# Patient Record
Sex: Male | Born: 1960 | Race: White | Hispanic: No | Marital: Single | Smoking: Never smoker
Health system: Southern US, Community
[De-identification: ages and names within clinical notes are randomized; demographics above are authoritative.]

## PROBLEM LIST (undated history)

## (undated) DIAGNOSIS — I639 Cerebral infarction, unspecified: Secondary | ICD-10-CM

---

## 2020-06-30 ENCOUNTER — Emergency Department (HOSPITAL_COMMUNITY)
Admission: EM | Admit: 2020-06-30 | Discharge: 2020-06-30 | Disposition: A | Discharge status: Home / Self Care | Payer: Medicaid Other | Attending: Emergency Medicine | Admitting: Emergency Medicine

## 2020-06-30 ENCOUNTER — Encounter (HOSPITAL_COMMUNITY): Payer: Self-pay

## 2020-06-30 ENCOUNTER — Emergency Department (HOSPITAL_COMMUNITY): Payer: Medicaid Other

## 2020-06-30 DIAGNOSIS — R059 Cough, unspecified: Secondary | ICD-10-CM | POA: Insufficient documentation

## 2020-06-30 DIAGNOSIS — R0602 Shortness of breath: Secondary | ICD-10-CM | POA: Diagnosis present

## 2020-06-30 DIAGNOSIS — R079 Chest pain, unspecified: Secondary | ICD-10-CM | POA: Diagnosis not present

## 2020-06-30 DIAGNOSIS — D72829 Elevated white blood cell count, unspecified: Secondary | ICD-10-CM | POA: Diagnosis not present

## 2020-06-30 HISTORY — DX: Cerebral infarction, unspecified: I63.9

## 2020-06-30 LAB — CBC
HCT: 47.9 % (ref 39.0–52.0)
Hemoglobin: 16.3 g/dL (ref 13.0–17.0)
MCH: 29.4 pg (ref 26.0–34.0)
MCHC: 34 g/dL (ref 30.0–36.0)
MCV: 86.5 fL (ref 80.0–100.0)
Platelets: 385 10*3/uL (ref 150–400)
RBC: 5.54 MIL/uL (ref 4.22–5.81)
RDW: 13.7 % (ref 11.5–15.5)
WBC: 12.5 10*3/uL — ABNORMAL HIGH (ref 4.0–10.5)
nRBC: 0 % (ref 0.0–0.2)

## 2020-06-30 LAB — BASIC METABOLIC PANEL
Anion gap: 7 (ref 5–15)
BUN: 15 mg/dL (ref 6–20)
CO2: 24 mmol/L (ref 22–32)
Calcium: 9.9 mg/dL (ref 8.9–10.3)
Chloride: 108 mmol/L (ref 98–111)
Creatinine, Ser: 0.99 mg/dL (ref 0.61–1.24)
GFR, Estimated: >60 mL/min (ref >60)
Glucose, Bld: 130 mg/dL — ABNORMAL HIGH (ref 70–99)
Potassium: 3.8 mmol/L (ref 3.5–5.1)
Sodium: 139 mmol/L (ref 135–145)

## 2020-06-30 LAB — RAPID URINE DRUG SCREEN, HOSP PERFORMED
Amphetamines: NOT DETECTED
Barbiturates: NOT DETECTED
Benzodiazepines: NOT DETECTED
Cocaine: POSITIVE — AB
Opiates: NOT DETECTED
Tetrahydrocannabinol: POSITIVE — AB

## 2020-06-30 LAB — TROPONIN I (HIGH SENSITIVITY)
Troponin I (High Sensitivity): 4 ng/L (ref <18)
Troponin I (High Sensitivity): 3 ng/L (ref <18)

## 2020-06-30 MED ORDER — SODIUM CHLORIDE 0.9 % IV BOLUS
1000.0000 mL | Freq: Once | INTRAVENOUS | Status: AC
  Administered 2020-06-30: 1000 mL via INTRAVENOUS

## 2020-06-30 MED ORDER — IPRATROPIUM BROMIDE HFA 17 MCG/ACT IN AERS
2.0000 | INHALATION_SPRAY | Freq: Once | RESPIRATORY_TRACT | Status: AC
  Administered 2020-06-30: 2 via RESPIRATORY_TRACT
  Filled 2020-06-30: qty 12.9

## 2020-06-30 MED ORDER — METHYLPREDNISOLONE SODIUM SUCC 125 MG IJ SOLR
125.0000 mg | Freq: Once | INTRAMUSCULAR | Status: AC
  Administered 2020-06-30: 125 mg via INTRAVENOUS
  Filled 2020-06-30: qty 2

## 2020-06-30 NOTE — ED Triage Notes (Signed)
Per [facility] yesterday after doing cocaine-also complaining of left knee and left hip pain

## 2020-06-30 NOTE — ED Provider Notes (Signed)
[facility]-EMERGENCY DEPT Provider Note   CSN: [id number] Arrival date & time: 06/30/20  1340     History Chief Complaint  Patient presents with  . Shortness of Breath    Dalton Harris is a 60 y.o. male.  60 y.o male with a PMH of Ischemic stroke presents to the ED with a chief complaint of sudden onset of shortness of breath and chest pain.  According to patient he has been under a lot of stress lately, he had "a wild night ", and states he snorted about "$300 worth of cocaine ", reports is the first time he does this in the company of his girlfriend and her 2 friends.  He reports only getting 2 hours of sleep last night, woke up agitated trying to find his girlfriend, walked from a trailer to trailer, on his way back to his trailer he became short of breath states "I felt like I could not breathe ", he called 911 when he arrived at home.  Reports pain along the rib area, especially on the left side, like "I am so sorry like after lifting weights ".  States that he was not using any alcohol but did have some marijuana along with cocaine yesterday.  He does have a prior history of COPD, however he has been out of medication for the past 2 weeks.  His symptoms have now resolved without any intervention. He denies any CAD, prior hx of family CAD, no hx of blood clots. Endorses daily tobacco use.    The history is provided by the patient.  Shortness of Breath Severity:  Moderate Onset quality:  Sudden Duration:  2 hours Timing:  Constant Progression:  Resolved Chronicity:  New Context: activity   Context: not URI   Relieved by:  Rest Worsened by:  Activity Ineffective treatments:  None tried Associated symptoms: chest pain and cough (chronic)   Associated symptoms: no abdominal pain, no fever, no headaches, no sore throat and no vomiting   Risk factors: tobacco use   Risk factors: no recent alcohol use, no hx of cancer, no hx of PE/DVT and no obesity         Past Medical History:  Diagnosis Date  . Ischemic stroke (HCC)     There are no problems to display for this patient.   History reviewed. No pertinent surgical history.     History reviewed. No pertinent family history.  Social History   Tobacco Use  . Smoking status: Never Smoker  . Smokeless tobacco: Never Used    Home Medications Prior to Admission medications   Not on File    Allergies    Patient has no known allergies.  Review of Systems   Review of Systems  Constitutional: Negative for fever.  HENT: Negative for sore throat.   Respiratory: Positive for cough (chronic) and shortness of breath.   Cardiovascular: Positive for chest pain.  Gastrointestinal: Negative for abdominal pain, nausea and vomiting.  Genitourinary: Negative for flank pain.  Musculoskeletal: Negative for back pain.  Skin: Negative for pallor and wound.  Neurological: Negative for headaches.  All other systems reviewed and are negative.   Physical Exam Updated Vital Signs BP 137/82   Pulse 60   Temp 98.4 F (36.9 C) (Oral)   Resp (!) 22   SpO2 100%   Physical Exam Vitals reviewed.  Constitutional:      Appearance: He is well-developed.  HENT:     Head: Normocephalic and atraumatic.  Cardiovascular:  Rate and Rhythm: Normal rate.  Pulmonary:     Effort: Pulmonary effort is normal.     Breath sounds: Examination of the right-middle field reveals decreased breath sounds. Examination of the right-lower field reveals decreased breath sounds. Decreased breath sounds present. No wheezing or rhonchi.  Chest:     Chest wall: No tenderness.  Abdominal:     Palpations: Abdomen is soft. There is no mass.  Musculoskeletal:     Cervical back: Normal range of motion and neck supple.     Right lower leg: No tenderness. No edema.     Left lower leg: No tenderness. No edema.  Skin:    General: Skin is warm and dry.  Neurological:     Mental Status: He is alert and oriented to  person, place, and time.     ED Results / Procedures / Treatments   Labs (all labs ordered are listed, but only abnormal results are displayed) Labs Reviewed  BASIC METABOLIC PANEL - Abnormal; Notable for the following components:      Result Value   Glucose, Bld 130 (*)    All other components within normal limits  CBC - Abnormal; Notable for the following components:   WBC 12.5 (*)    All other components within normal limits  RAPID URINE DRUG SCREEN, HOSP PERFORMED - Abnormal; Notable for the following components:   Cocaine POSITIVE (*)    Tetrahydrocannabinol POSITIVE (*)    All other components within normal limits  TROPONIN I (HIGH SENSITIVITY)  TROPONIN I (HIGH SENSITIVITY)    EKG None  Radiology DG Chest Port 1 View  Result Date: 06/30/2020 CLINICAL DATA:  Chest pain, shortness of breath, and weakness. EXAM: PORTABLE CHEST 1 VIEW COMPARISON:  None. FINDINGS: The heart size and mediastinal contours are within normal limits. Both lungs are clear. The visualized skeletal structures are unremarkable. IMPRESSION: No active disease. Electronically Signed   By: Obie Dredge M.D.   On: 06/30/2020 14:09    Procedures Procedures   Medications Ordered in ED Medications  sodium chloride 0.9 % bolus 1,000 mL (1,000 mLs Intravenous New Bag/Given 06/30/20 1558)  methylPREDNISolone sodium succinate (SOLU-MEDROL) 125 mg/2 mL injection 125 mg (125 mg Intravenous Given 06/30/20 1558)  ipratropium (ATROVENT HFA) inhaler 2 puff (2 puffs Inhalation Given 06/30/20 1557)    ED Course  I have reviewed the triage vital signs and the nursing notes.  Pertinent labs & imaging results that were available during my care of the patient were reviewed by me and considered in my medical decision making (see chart for details).  Clinical Course as of 06/30/20 1700  Thu Jun 30, 2020  1537 Troponin I (High Sensitivity): 4 [JS]  1537 WBC(!): 12.5 [JS]  1656 COCAINE(!): POSITIVE [JS]  1656  Tetrahydrocannabinol(!): POSITIVE [JS]    Clinical Course User Index [JS] Claude Manges, PA-C   MDM Rules/Calculators/A&P  Patient presents to the ED with a chief complaint of shortness of breath and chest pain, this was a sudden onset during exertion while walking back to his trailer after being agitated this morning.  In addition, patient endorses snorting "$300 worth of cocaine ", also endorse smoking marijuana prior to his arrival in the ED.  According to patient is a first time he uses cocaine.  He immediately called EMS, was transferred to the ED, did not have any intervention, he is somatic at this time.  Does have a history of COPD, has been out of his medication for the past 2 weeks.  During my primary evaluation, patient is overall non-ill, nontoxic appearing.  Vitals are within normal limits, normotensive, heart rate is 64, without any signs of hypoxia starting in 100% on room air.  Concern diminished to auscultation especially at the base of the right lower lung.  No wheezing or rhonchi noted.  Abdomen is soft, nontender to palpation.  Chest wall without any pain to palpation.  No pitting edema, no calf tenderness bilaterally.  Moves all upper and lower extremities.  Differential diagnosis included but not limited to COPD exacerbation, ACS, vasospasm post cocaine use.   Interpretation of his labs reveal a CBC with a slight leukocytosis of 12.5, hemoglobin is within normal limits.  BMP without any electrolyte derangement, creatinine levels within normal limits, he denies any abdominal pain, nausea, vomiting.  X-ray without any acute findings such as pneumonia, pleural effusion, pneumothorax.  First troponin is 4, will obtain delta.  He will also be provided with symptomatic control at this time.  UDS is positive for THC, cocaine, first troponin is flat, while awaiting delta Trope.  Patient became agitated stating "he needed to leave, he needed to get out of here ", he eloped from the  emergency department. I was unable to finish my examination as he LEFT AGAINST MEDICAL ADVICE.     Portions of this note were generated with Scientist, clinical (histocompatibility and immunogenetics). Dictation errors may occur despite best attempts at proofreading.  Final Clinical Impression(s) / ED Diagnoses Final diagnoses:  Shortness of breath    Rx / DC Orders ED Discharge Orders    None       Claude Manges, PA-C 06/30/20 1700    Mancel Bale, MD 07/02/20 1100

## 2020-06-30 NOTE — ED Provider Notes (Signed)
Emergency Medicine Provider Triage Evaluation Note  Dalton Harris , a 60 y.o. male  was evaluated in triage.  Pt complains of chest pain.  Review of Systems  Positive: Cp, sob, leg pain Negative: Fever, cough,   Physical Exam  There were no vitals taken for this visit. Gen:   Awake, no distress   Resp:  Normal effort  MSK:   Moves extremities without difficulty  Other:    Medical Decision Making  Medically screening exam initiated at 1:49 PM.  Appropriate orders placed.  Dalton Harris was informed that the remainder of the evaluation will be completed by another provider, this initial triage assessment does not replace that evaluation, and the importance of remaining in the ED until their evaluation is complete.  Pt report developing chest pain earlier today when he was upset.  Does endorse mild sob. Report heavy cocaine use last night.  Has intermittent pain to L hip and L knee for years, no leg swelling no hx of DVT.     Dalton Helper, PA-C 06/30/20 1351    Dalton Donovan, MD 06/30/20 [phone]

## 2020-06-30 NOTE — ED Notes (Signed)
Pt declines IV at this time stating he wants to go home and feels "fine now." ED PA-C notified and aware.
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
## 2020-07-10 ENCOUNTER — Encounter (HOSPITAL_COMMUNITY): Payer: Self-pay | Admitting: Pharmacy Technician

## 2020-07-10 ENCOUNTER — Emergency Department (HOSPITAL_COMMUNITY): Payer: Medicaid Other

## 2020-07-10 ENCOUNTER — Other Ambulatory Visit: Payer: Self-pay

## 2020-07-10 ENCOUNTER — Emergency Department (HOSPITAL_COMMUNITY)
Admission: EM | Admit: 2020-07-10 | Discharge: 2020-07-10 | Disposition: A | Discharge status: Home / Self Care | Payer: Medicaid Other | Attending: Emergency Medicine | Admitting: Emergency Medicine

## 2020-07-10 DIAGNOSIS — Y9301 Activity, walking, marching and hiking: Secondary | ICD-10-CM | POA: Insufficient documentation

## 2020-07-10 DIAGNOSIS — Z20822 Contact with and (suspected) exposure to covid-19: Secondary | ICD-10-CM | POA: Diagnosis not present

## 2020-07-10 DIAGNOSIS — S069X9A Unspecified intracranial injury with loss of consciousness of unspecified duration, initial encounter: Secondary | ICD-10-CM | POA: Diagnosis not present

## 2020-07-10 DIAGNOSIS — Y92480 Sidewalk as the place of occurrence of the external cause: Secondary | ICD-10-CM | POA: Insufficient documentation

## 2020-07-10 DIAGNOSIS — R0782 Intercostal pain: Secondary | ICD-10-CM | POA: Diagnosis not present

## 2020-07-10 DIAGNOSIS — R52 Pain, unspecified: Secondary | ICD-10-CM

## 2020-07-10 DIAGNOSIS — S0990XA Unspecified injury of head, initial encounter: Secondary | ICD-10-CM | POA: Diagnosis present

## 2020-07-10 DIAGNOSIS — M25532 Pain in left wrist: Secondary | ICD-10-CM | POA: Insufficient documentation

## 2020-07-10 DIAGNOSIS — T1490XA Injury, unspecified, initial encounter: Secondary | ICD-10-CM

## 2020-07-10 LAB — COMPREHENSIVE METABOLIC PANEL
ALT: 16 U/L (ref 0–44)
AST: 20 U/L (ref 15–41)
Albumin: 4 g/dL (ref 3.5–5.0)
Alkaline Phosphatase: 72 U/L (ref 38–126)
Anion gap: 9 (ref 5–15)
BUN: 12 mg/dL (ref 6–20)
CO2: 25 mmol/L (ref 22–32)
Calcium: 9.4 mg/dL (ref 8.9–10.3)
Chloride: 102 mmol/L (ref 98–111)
Creatinine, Ser: 1.06 mg/dL (ref 0.61–1.24)
GFR, Estimated: >60 mL/min (ref >60)
Glucose, Bld: 132 mg/dL — ABNORMAL HIGH (ref 70–99)
Potassium: 3.6 mmol/L (ref 3.5–5.1)
Sodium: 136 mmol/L (ref 135–145)
Total Bilirubin: 0.9 mg/dL (ref 0.3–1.2)
Total Protein: 6.7 g/dL (ref 6.5–8.1)

## 2020-07-10 LAB — CBC
HCT: 43.9 % (ref 39.0–52.0)
Hemoglobin: 15.4 g/dL (ref 13.0–17.0)
MCH: 30 pg (ref 26.0–34.0)
MCHC: 35.1 g/dL (ref 30.0–36.0)
MCV: 85.6 fL (ref 80.0–100.0)
Platelets: 384 10*3/uL (ref 150–400)
RBC: 5.13 MIL/uL (ref 4.22–5.81)
RDW: 13.3 % (ref 11.5–15.5)
WBC: 21.9 10*3/uL — ABNORMAL HIGH (ref 4.0–10.5)
nRBC: 0 % (ref 0.0–0.2)

## 2020-07-10 LAB — I-STAT CHEM 8, ED
BUN: 11 mg/dL (ref 6–20)
Calcium, Ion: 1.2 mmol/L (ref 1.15–1.40)
Chloride: 103 mmol/L (ref 98–111)
Creatinine, Ser: 0.9 mg/dL (ref 0.61–1.24)
Glucose, Bld: 129 mg/dL — ABNORMAL HIGH (ref 70–99)
HCT: 46 % (ref 39.0–52.0)
Hemoglobin: 15.6 g/dL (ref 13.0–17.0)
Potassium: 3.5 mmol/L (ref 3.5–5.1)
Sodium: 138 mmol/L (ref 135–145)
TCO2: 23 mmol/L (ref 22–32)

## 2020-07-10 LAB — PROTIME-INR
INR: 1.1 (ref 0.8–1.2)
Prothrombin Time: 14.4 seconds (ref 11.4–15.2)

## 2020-07-10 LAB — SAMPLE TO BLOOD BANK

## 2020-07-10 LAB — LACTIC ACID, PLASMA: Lactic Acid, Venous: 1.5 mmol/L (ref 0.5–1.9)

## 2020-07-10 LAB — RESP PANEL BY RT-PCR (FLU A&B, COVID) ARPGX2
Influenza A by PCR: NEGATIVE
Influenza B by PCR: NEGATIVE
SARS Coronavirus 2 by RT PCR: NEGATIVE

## 2020-07-10 LAB — ETHANOL: Alcohol, Ethyl (B): <10 mg/dL (ref <10)

## 2020-07-10 MED ORDER — IOHEXOL 300 MG/ML  SOLN
100.0000 mL | Freq: Once | INTRAMUSCULAR | Status: AC | PRN
  Administered 2020-07-10: 100 mL via INTRAVENOUS

## 2020-07-10 NOTE — Discharge Instructions (Addendum)
Of the pancreas.  No evidence of trauma seen on your work-up today. Please do not use meth Follow-up with your doctor if any ongoing problems Your CT scan does reveal 2 pulmonary nodules and a hypodense lesion in the pancreas.  Please follow-up with your doctor for further information regarding need for follow-up.
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
## 2020-07-10 NOTE — ED Notes (Signed)
[place] C-collar applied to pt
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
## 2020-07-10 NOTE — ED Notes (Signed)
Patient transported to CT by TRN nurse 
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
## 2020-09-08 ENCOUNTER — Emergency Department (HOSPITAL_COMMUNITY): Payer: Medicaid Other

## 2020-09-08 ENCOUNTER — Other Ambulatory Visit: Payer: Self-pay

## 2020-09-08 ENCOUNTER — Inpatient Hospital Stay (HOSPITAL_COMMUNITY)
Admission: EM | Admit: 2020-09-08 | Discharge: 2020-09-13 | DRG: 918 | Disposition: A | Discharge status: Home / Self Care | Payer: Medicaid Other | Attending: Internal Medicine | Admitting: Internal Medicine

## 2020-09-08 DIAGNOSIS — T43622A Poisoning by amphetamines, intentional self-harm, initial encounter: Secondary | ICD-10-CM | POA: Diagnosis present

## 2020-09-08 DIAGNOSIS — I10 Essential (primary) hypertension: Secondary | ICD-10-CM | POA: Diagnosis present

## 2020-09-08 DIAGNOSIS — E785 Hyperlipidemia, unspecified: Secondary | ICD-10-CM | POA: Diagnosis present

## 2020-09-08 DIAGNOSIS — E7849 Other hyperlipidemia: Secondary | ICD-10-CM | POA: Diagnosis not present

## 2020-09-08 DIAGNOSIS — T40601A Poisoning by unspecified narcotics, accidental (unintentional), initial encounter: Secondary | ICD-10-CM | POA: Diagnosis present

## 2020-09-08 DIAGNOSIS — R400 Somnolence: Secondary | ICD-10-CM | POA: Diagnosis present

## 2020-09-08 DIAGNOSIS — T1491XA Suicide attempt, initial encounter: Secondary | ICD-10-CM | POA: Diagnosis not present

## 2020-09-08 DIAGNOSIS — T40602A Poisoning by unspecified narcotics, intentional self-harm, initial encounter: Secondary | ICD-10-CM | POA: Diagnosis not present

## 2020-09-08 DIAGNOSIS — Z8673 Personal history of transient ischemic attack (TIA), and cerebral infarction without residual deficits: Secondary | ICD-10-CM | POA: Diagnosis not present

## 2020-09-08 DIAGNOSIS — Z20822 Contact with and (suspected) exposure to covid-19: Secondary | ICD-10-CM | POA: Diagnosis present

## 2020-09-08 DIAGNOSIS — F1410 Cocaine abuse, uncomplicated: Secondary | ICD-10-CM | POA: Diagnosis present

## 2020-09-08 DIAGNOSIS — Z79899 Other long term (current) drug therapy: Secondary | ICD-10-CM

## 2020-09-08 DIAGNOSIS — T40692A Poisoning by other narcotics, intentional self-harm, initial encounter: Principal | ICD-10-CM | POA: Diagnosis present

## 2020-09-08 DIAGNOSIS — J449 Chronic obstructive pulmonary disease, unspecified: Secondary | ICD-10-CM | POA: Diagnosis present

## 2020-09-08 DIAGNOSIS — F1210 Cannabis abuse, uncomplicated: Secondary | ICD-10-CM | POA: Diagnosis present

## 2020-09-08 DIAGNOSIS — Z7951 Long term (current) use of inhaled steroids: Secondary | ICD-10-CM

## 2020-09-08 DIAGNOSIS — Z7902 Long term (current) use of antithrombotics/antiplatelets: Secondary | ICD-10-CM

## 2020-09-08 LAB — CBC
HCT: 38.2 % — ABNORMAL LOW (ref 39.0–52.0)
Hemoglobin: 12.8 g/dL — ABNORMAL LOW (ref 13.0–17.0)
MCH: 29.4 pg (ref 26.0–34.0)
MCHC: 33.5 g/dL (ref 30.0–36.0)
MCV: 87.8 fL (ref 80.0–100.0)
Platelets: 286 10*3/uL (ref 150–400)
RBC: 4.35 MIL/uL (ref 4.22–5.81)
RDW: 13.5 % (ref 11.5–15.5)
WBC: 8.7 10*3/uL (ref 4.0–10.5)
nRBC: 0 % (ref 0.0–0.2)

## 2020-09-08 LAB — COMPREHENSIVE METABOLIC PANEL
ALT: 13 U/L (ref 0–44)
AST: 17 U/L (ref 15–41)
Albumin: 3.4 g/dL — ABNORMAL LOW (ref 3.5–5.0)
Alkaline Phosphatase: 47 U/L (ref 38–126)
Anion gap: 7 (ref 5–15)
BUN: 8 mg/dL (ref 6–20)
CO2: 25 mmol/L (ref 22–32)
Calcium: 8.4 mg/dL — ABNORMAL LOW (ref 8.9–10.3)
Chloride: 105 mmol/L (ref 98–111)
Creatinine, Ser: 0.63 mg/dL (ref 0.61–1.24)
GFR, Estimated: >60 mL/min (ref >60)
Glucose, Bld: 101 mg/dL — ABNORMAL HIGH (ref 70–99)
Potassium: 3.3 mmol/L — ABNORMAL LOW (ref 3.5–5.1)
Sodium: 137 mmol/L (ref 135–145)
Total Bilirubin: 0.7 mg/dL (ref 0.3–1.2)
Total Protein: 5.6 g/dL — ABNORMAL LOW (ref 6.5–8.1)

## 2020-09-08 LAB — RESP PANEL BY RT-PCR (FLU A&B, COVID) ARPGX2
Influenza A by PCR: NEGATIVE
Influenza B by PCR: NEGATIVE
SARS Coronavirus 2 by RT PCR: NEGATIVE

## 2020-09-08 LAB — CREATININE, SERUM
Creatinine, Ser: 0.8 mg/dL (ref 0.61–1.24)
GFR, Estimated: >60 mL/min (ref >60)

## 2020-09-08 LAB — I-STAT CHEM 8, ED
BUN: 5 mg/dL — ABNORMAL LOW (ref 6–20)
Calcium, Ion: 1.19 mmol/L (ref 1.15–1.40)
Chloride: 103 mmol/L (ref 98–111)
Creatinine, Ser: 0.7 mg/dL (ref 0.61–1.24)
Glucose, Bld: 103 mg/dL — ABNORMAL HIGH (ref 70–99)
HCT: 34 % — ABNORMAL LOW (ref 39.0–52.0)
Hemoglobin: 11.6 g/dL — ABNORMAL LOW (ref 13.0–17.0)
Potassium: 3.4 mmol/L — ABNORMAL LOW (ref 3.5–5.1)
Sodium: 140 mmol/L (ref 135–145)
TCO2: 24 mmol/L (ref 22–32)

## 2020-09-08 LAB — CBC WITH DIFFERENTIAL/PLATELET
Abs Immature Granulocytes: 0.02 10*3/uL (ref 0.00–0.07)
Basophils Absolute: 0.1 10*3/uL (ref 0.0–0.1)
Basophils Relative: 1 %
Eosinophils Absolute: 0 10*3/uL (ref 0.0–0.5)
Eosinophils Relative: 0 %
HCT: 36.4 % — ABNORMAL LOW (ref 39.0–52.0)
Hemoglobin: 12.4 g/dL — ABNORMAL LOW (ref 13.0–17.0)
Immature Granulocytes: 0 %
Lymphocytes Relative: 21 %
Lymphs Abs: 1.9 10*3/uL (ref 0.7–4.0)
MCH: 29.6 pg (ref 26.0–34.0)
MCHC: 34.1 g/dL (ref 30.0–36.0)
MCV: 86.9 fL (ref 80.0–100.0)
Monocytes Absolute: 0.7 10*3/uL (ref 0.1–1.0)
Monocytes Relative: 7 %
Neutro Abs: 6.3 10*3/uL (ref 1.7–7.7)
Neutrophils Relative %: 71 %
Platelets: 291 10*3/uL (ref 150–400)
RBC: 4.19 MIL/uL — ABNORMAL LOW (ref 4.22–5.81)
RDW: 13.6 % (ref 11.5–15.5)
WBC: 8.9 10*3/uL (ref 4.0–10.5)
nRBC: 0 % (ref 0.0–0.2)

## 2020-09-08 LAB — ACETAMINOPHEN LEVEL
Acetaminophen (Tylenol), Serum: <10 ug/mL — ABNORMAL LOW (ref 10–30)
Acetaminophen (Tylenol), Serum: <10 ug/mL — ABNORMAL LOW (ref 10–30)

## 2020-09-08 LAB — RAPID URINE DRUG SCREEN, HOSP PERFORMED
Amphetamines: POSITIVE — AB
Barbiturates: NOT DETECTED
Benzodiazepines: NOT DETECTED
Cocaine: POSITIVE — AB
Opiates: NOT DETECTED
Tetrahydrocannabinol: POSITIVE — AB

## 2020-09-08 LAB — MAGNESIUM: Magnesium: 1.9 mg/dL (ref 1.7–2.4)

## 2020-09-08 LAB — ETHANOL: Alcohol, Ethyl (B): <10 mg/dL (ref <10)

## 2020-09-08 LAB — CBG MONITORING, ED: Glucose-Capillary: 100 mg/dL — ABNORMAL HIGH (ref 70–99)

## 2020-09-08 LAB — SALICYLATE LEVEL: Salicylate Lvl: <7 mg/dL — ABNORMAL LOW (ref 7.0–30.0)

## 2020-09-08 MED ORDER — NALOXONE HCL 2 MG/2ML IJ SOSY: 1.0000 mg | PREFILLED_SYRINGE | Freq: Once | INTRAMUSCULAR | Status: DC

## 2020-09-08 MED ORDER — POTASSIUM CHLORIDE CRYS ER 20 MEQ PO TBCR
20.0000 meq | EXTENDED_RELEASE_TABLET | Freq: Once | ORAL | Status: AC
  Administered 2020-09-08: 20 meq via ORAL
  Filled 2020-09-08: qty 1

## 2020-09-08 MED ORDER — KCL-LACTATED RINGERS-D5W 20 MEQ/L IV SOLN
INTRAVENOUS | Status: DC
  Filled 2020-09-08 (×3): qty 1000

## 2020-09-08 MED ORDER — ONDANSETRON HCL 4 MG PO TABS: 4.0000 mg | ORAL_TABLET | Freq: Four times a day (QID) | ORAL | Status: DC | PRN

## 2020-09-08 MED ORDER — ONDANSETRON HCL 4 MG/2ML IJ SOLN: 4.0000 mg | Freq: Four times a day (QID) | INTRAMUSCULAR | Status: DC | PRN

## 2020-09-08 MED ORDER — ACETAMINOPHEN 325 MG PO TABS: 650.0000 mg | ORAL_TABLET | Freq: Four times a day (QID) | ORAL | Status: DC | PRN

## 2020-09-08 MED ORDER — NALOXONE HCL 4 MG/10ML IJ SOLN
2.0000 mg/h | INTRAVENOUS | Status: DC
  Filled 2020-09-08: qty 10

## 2020-09-08 MED ORDER — ACETAMINOPHEN 650 MG RE SUPP: 650.0000 mg | Freq: Four times a day (QID) | RECTAL | Status: DC | PRN

## 2020-09-08 MED ORDER — NALOXONE HCL 2 MG/2ML IJ SOSY
2.0000 mg | PREFILLED_SYRINGE | Freq: Once | INTRAMUSCULAR | Status: AC
  Administered 2020-09-08: 2 mg via INTRAVENOUS
  Filled 2020-09-08: qty 2

## 2020-09-08 MED ORDER — NICOTINE 21 MG/24HR TD PT24
21.0000 mg | MEDICATED_PATCH | Freq: Every day | TRANSDERMAL | Status: DC
  Administered 2020-09-09 – 2020-09-12 (×4): 21 mg via TRANSDERMAL
  Filled 2020-09-08 (×4): qty 1

## 2020-09-08 MED ORDER — ACTIDOSE WITH SORBITOL 50 GM/240ML PO LIQD
50.0000 g | Freq: Once | ORAL | Status: DC
  Filled 2020-09-08: qty 240

## 2020-09-08 MED ORDER — ONDANSETRON HCL 4 MG/2ML IJ SOLN
4.0000 mg | Freq: Once | INTRAMUSCULAR | Status: AC
  Administered 2020-09-08: 4 mg via INTRAVENOUS
  Filled 2020-09-08: qty 2

## 2020-09-08 MED ORDER — NALOXONE HCL 4 MG/10ML IJ SOLN
2.0000 mg/h | INTRAVENOUS | Status: AC
  Filled 2020-09-08: qty 10

## 2020-09-08 MED ORDER — ENOXAPARIN SODIUM 40 MG/0.4ML IJ SOSY
40.0000 mg | PREFILLED_SYRINGE | INTRAMUSCULAR | Status: DC
  Administered 2020-09-08 – 2020-09-12 (×5): 40 mg via SUBCUTANEOUS
  Filled 2020-09-08 (×5): qty 0.4
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
## 2020-09-08 NOTE — ED Notes (Signed)
Dalton Harris [phone] Patients close friend would like a call back  This person called 911 for the patient and states they do not have anyone on the contact list

## 2020-09-08 NOTE — ED Notes (Signed)
Patient given food and drink, per Dr. Raliegh Ip orders.

## 2020-09-08 NOTE — ED Notes (Signed)
Spoke to poison control and gave update to [place], Charity fundraiser.

## 2020-09-09 DIAGNOSIS — T40602A Poisoning by unspecified narcotics, intentional self-harm, initial encounter: Secondary | ICD-10-CM

## 2020-09-09 DIAGNOSIS — I10 Essential (primary) hypertension: Secondary | ICD-10-CM

## 2020-09-09 LAB — COMPREHENSIVE METABOLIC PANEL
ALT: 12 U/L (ref 0–44)
AST: 15 U/L (ref 15–41)
Albumin: 3.2 g/dL — ABNORMAL LOW (ref 3.5–5.0)
Alkaline Phosphatase: 44 U/L (ref 38–126)
Anion gap: 6 (ref 5–15)
BUN: 7 mg/dL (ref 6–20)
CO2: 26 mmol/L (ref 22–32)
Calcium: 8.7 mg/dL — ABNORMAL LOW (ref 8.9–10.3)
Chloride: 107 mmol/L (ref 98–111)
Creatinine, Ser: 0.71 mg/dL (ref 0.61–1.24)
GFR, Estimated: >60 mL/min (ref >60)
Glucose, Bld: 112 mg/dL — ABNORMAL HIGH (ref 70–99)
Potassium: 3.9 mmol/L (ref 3.5–5.1)
Sodium: 139 mmol/L (ref 135–145)
Total Bilirubin: 0.9 mg/dL (ref 0.3–1.2)
Total Protein: 5.5 g/dL — ABNORMAL LOW (ref 6.5–8.1)

## 2020-09-09 LAB — CBC
HCT: 37.9 % — ABNORMAL LOW (ref 39.0–52.0)
Hemoglobin: 12.8 g/dL — ABNORMAL LOW (ref 13.0–17.0)
MCH: 30.1 pg (ref 26.0–34.0)
MCHC: 33.8 g/dL (ref 30.0–36.0)
MCV: 89.2 fL (ref 80.0–100.0)
Platelets: 263 10*3/uL (ref 150–400)
RBC: 4.25 MIL/uL (ref 4.22–5.81)
RDW: 13.6 % (ref 11.5–15.5)
WBC: 6.2 10*3/uL (ref 4.0–10.5)
nRBC: 0 % (ref 0.0–0.2)

## 2020-09-09 LAB — HIV ANTIBODY (ROUTINE TESTING W REFLEX): HIV Screen 4th Generation wRfx: NONREACTIVE
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
## 2020-09-09 NOTE — TOC Progression Note (Addendum)
Transition of Care [facility]) - Progression Note    Patient Details  Name: Dalton Harris MRN: [id number] Date of Birth: 22-Jan-1961  Transition of [facility]) CM/SW Contact  Darleene Cleaver, [place] Phone Number: 09/09/2020, 6:13 PM  Clinical Narrative:     Patient needs inpatient psych placement, bed search to be initiated.  [facility] and [facility] no beds available today.  Patient has been placed under IVC effective 8/11 till 8/18.       Expected Discharge Plan and Services                                                 Social Determinants of Health (SDOH) Interventions    Readmission Risk Interventions No flowsheet data found.

## 2020-09-09 NOTE — ED Notes (Signed)
Pt given coffee and ham sandwich ahead of breakfast.  No acute distress. Sitter remains at the bedside
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
## 2020-09-09 NOTE — ED Notes (Signed)
Dr. Mikeal Hawthorne aware of patients EKG.

## 2020-09-10 DIAGNOSIS — T1491XA Suicide attempt, initial encounter: Secondary | ICD-10-CM
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
## 2020-09-10 NOTE — Plan of Care (Signed)
  Problem: Clinical Measurements: Goal: Ability to maintain clinical measurements within normal limits will improve Outcome: Completed/Met
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
## 2020-09-10 NOTE — Plan of Care (Signed)
  Problem: Clinical Measurements: Goal: Will remain free from infection Outcome: Progressing Goal: Respiratory complications will improve Outcome: Progressing   Problem: Nutrition: Goal: Adequate nutrition will be maintained Outcome: Progressing   Problem: Coping: Goal: Level of anxiety will decrease Outcome: Progressing   

## 2020-09-11 DIAGNOSIS — J449 Chronic obstructive pulmonary disease, unspecified: Secondary | ICD-10-CM

## 2020-09-11 MED ORDER — DOXYLAMINE SUCCINATE (SLEEP) 25 MG PO TABS
25.0000 mg | ORAL_TABLET | Freq: Once | ORAL | Status: AC
  Administered 2020-09-11: 25 mg via ORAL
  Filled 2020-09-11: qty 1

## 2020-09-11 NOTE — Progress Notes (Signed)
[facility] PROGRESS NOTE    Progress Note  Dalton Harris  YTK:[id number] DOB: 1961/01/10 DOA: 09/08/2020 PCP: Pcp, No     Brief Narrative:   Dalton Harris is an 60 y.o. male past medical history significant for CVA polysubstance abuse hyperlipidemia brought in due to a drug overdose, he relates his overall quality daughter aquatics #35 pills of once is a plan of ending all brought into the ED somnolent with some respiratory depression given Narcan and placed on Narcan drip   Assessment/Plan:   Suicide attempt probably narcotic overdose: UDS negative for opiates. He took 35 pills unknown quantity has been on Narcan drip. Psychiatry recommended inpatient psychiatric admission  Essential hypertension: Blood pressure seems to be well controlled, will hold antihypertensive medication  Polysubstance abuse: He is positive for amphetamines cocaine and marijuana.  Sinus bradycardia: Probably due to chronic cocaine abuse.  COPD: Noted.  Hyperlipidemia: Continue statins.    DVT prophylaxis: lovenox Family Communication:none Status is: Inpatient  Remains inpatient appropriate because:Hemodynamically unstable  Dispo: The patient is from: Home              Anticipated d/c is to: Home              Patient currently is not medically stable to d/c.   Difficult to place patient No        Code Status:     Code Status Orders  (From admission, onward)           Start     Ordered   09/08/20 2038  Full code  Continuous        09/08/20 2037           Code Status History     This patient has a current code status but no historical code status.         IV Access:   Peripheral IV   Procedures and diagnostic studies:   No results found.   Medical Consultants:   None.   Subjective:    Dalton Harris does not want to be bothered  Objective:    Vitals:   09/10/20 0439 09/10/20 1350 09/10/20 2018 09/11/20 0423  BP: 116/68 133/67  128/65 121/66  Pulse: (!) 42 61 (!) 46 (!) 46  Resp: 18 16 16 18   Temp: 98.5 F (36.9 C) 97.7 F (36.5 C) 98.1 F (36.7 C) 97.7 F (36.5 C)  TempSrc: Oral Oral Oral Oral  SpO2: 100% 99% 100% 99%   SpO2: 99 %   Intake/Output Summary (Last 24 hours) at 09/11/2020 0935 Last data filed at 09/11/2020 09/13/2020 Gross per 24 hour  Intake 1566 ml  Output --  Net 1566 ml    There were no vitals filed for this visit.  Exam: Refused physical exam  Data Reviewed:    Labs: Basic Metabolic Panel: Recent Labs  Lab 09/08/20 1627 09/08/20 1634 09/08/20 2043 09/09/20 0339  NA 137 140  --  139  K 3.3* 3.4*  --  3.9  CL 105 103  --  107  CO2 25  --   --  26  GLUCOSE 101* 103*  --  112*  BUN 8 5*  --  7  CREATININE 0.63 0.70 0.80 0.71  CALCIUM 8.4*  --   --  8.7*  MG 1.9  --   --   --     GFR CrCl cannot be calculated (Unknown ideal weight.). Liver Function Tests: Recent Labs  Lab 09/08/20 1627 09/09/20 0339  AST 17  15  ALT 13 12  ALKPHOS 47 44  BILITOT 0.7 0.9  PROT 5.6* 5.5*  ALBUMIN 3.4* 3.2*    No results for input(s): LIPASE, AMYLASE in the last 168 hours. No results for input(s): AMMONIA in the last 168 hours. Coagulation profile No results for input(s): INR, PROTIME in the last 168 hours. COVID-19 Labs  No results for input(s): DDIMER, FERRITIN, LDH, CRP in the last 72 hours.  Lab Results  Component Value Date   SARSCOV2NAA NEGATIVE 09/08/2020   SARSCOV2NAA NEGATIVE 07/10/2020    CBC: Recent Labs  Lab 09/08/20 1627 09/08/20 1634 09/08/20 2043 09/09/20 0339  WBC 8.9  --  8.7 6.2  NEUTROABS 6.3  --   --   --   HGB 12.4* 11.6* 12.8* 12.8*  HCT 36.4* 34.0* 38.2* 37.9*  MCV 86.9  --  87.8 89.2  PLT 291  --  286 263    Cardiac Enzymes: No results for input(s): CKTOTAL, CKMB, CKMBINDEX, TROPONINI in the last 168 hours. BNP (last 3 results) No results for input(s): PROBNP in the last 8760 hours. CBG: Recent Labs  Lab 09/08/20 1639  GLUCAP 100*     D-Dimer: No results for input(s): DDIMER in the last 72 hours. Hgb A1c: No results for input(s): HGBA1C in the last 72 hours. Lipid Profile: No results for input(s): CHOL, HDL, LDLCALC, TRIG, CHOLHDL, LDLDIRECT in the last 72 hours. Thyroid function studies: No results for input(s): TSH, T4TOTAL, T3FREE, THYROIDAB in the last 72 hours.  Invalid input(s): FREET3 Anemia work up: No results for input(s): VITAMINB12, FOLATE, FERRITIN, TIBC, IRON, RETICCTPCT in the last 72 hours. Sepsis Labs: Recent Labs  Lab 09/08/20 1627 09/08/20 2043 09/09/20 0339  WBC 8.9 8.7 6.2    Microbiology Recent Results (from the past 240 hour(s))  Resp Panel by RT-PCR (Flu A&B, Covid) Nasopharyngeal Swab     Status: None   Collection Time: 09/08/20  8:55 PM   Specimen: Nasopharyngeal Swab; Nasopharyngeal(NP) swabs in vial transport medium  Result Value Ref Range Status   SARS Coronavirus 2 by RT PCR NEGATIVE NEGATIVE Final    Comment: (NOTE) SARS-CoV-2 target nucleic acids are NOT DETECTED.  The SARS-CoV-2 RNA is generally detectable in upper respiratory specimens during the acute phase of infection. The lowest concentration of SARS-CoV-2 viral copies this assay can detect is 138 copies/mL. A negative result does not preclude SARS-Cov-2 infection and should not be used as the sole basis for treatment or other patient management decisions. A negative result may occur with  improper specimen collection/handling, submission of specimen other than nasopharyngeal swab, presence of viral mutation(s) within the areas targeted by this assay, and inadequate number of viral copies(<138 copies/mL). A negative result must be combined with clinical observations, patient history, and epidemiological information. The expected result is Negative.  Fact Sheet for Patients:  BloggerCourse.com  Fact Sheet for Healthcare Providers:  SeriousBroker.it  This  test is no t yet approved or cleared by the [place] FDA and  has been authorized for detection and/or diagnosis of SARS-CoV-2 by FDA under an Emergency Use Authorization (EUA). This EUA will remain  in effect (meaning this test can be used) for the duration of the COVID-19 declaration under Section 564(b)(1) of the Act, 21 U.S.C.section 360bbb-3(b)(1), unless the authorization is terminated  or revoked sooner.       Influenza A by PCR NEGATIVE NEGATIVE Final   Influenza B by PCR NEGATIVE NEGATIVE Final    Comment: (NOTE) The Xpert Xpress SARS-CoV-2/FLU/RSV plus  assay is intended as an aid in the diagnosis of influenza from Nasopharyngeal swab specimens and should not be used as a sole basis for treatment. Nasal washings and aspirates are unacceptable for Xpert Xpress SARS-CoV-2/FLU/RSV testing.  Fact Sheet for Patients: BloggerCourse.com  Fact Sheet for Healthcare Providers: SeriousBroker.it  This test is not yet approved or cleared by the [place] FDA and has been authorized for detection and/or diagnosis of SARS-CoV-2 by FDA under an Emergency Use Authorization (EUA). This EUA will remain in effect (meaning this test can be used) for the duration of the COVID-19 declaration under Section 564(b)(1) of the Act, 21 U.S.C. section 360bbb-3(b)(1), unless the authorization is terminated or revoked.  Performed at [facility], [address] [place], [place] [address]      Medications:    enoxaparin (LOVENOX) injection  40 mg Subcutaneous Q24H   nicotine  21 mg Transdermal Daily   Continuous Infusions:  dextrose 5% lactated ringers with KCl 20 mEq/L 10 mL/hr at 09/09/20 0736      LOS: 3 days   Marinda Elk  [facility]  09/11/2020, 9:35 AM

## 2020-09-11 NOTE — Plan of Care (Signed)
  Problem: Clinical Measurements: Goal: Will remain free from infection Outcome: Progressing   Problem: Activity: Goal: Risk for activity intolerance will decrease Outcome: Adequate for Discharge   Problem: Nutrition: Goal: Adequate nutrition will be maintained Outcome: Adequate for Discharge   Problem: Elimination: Goal: Will not experience complications related to urinary retention Outcome: Adequate for Discharge   Problem: Pain Managment: Goal: General experience of comfort will improve Outcome: Adequate for Discharge   Problem: Safety: Goal: Ability to remain free from injury will improve Outcome: Progressing

## 2020-09-12 DIAGNOSIS — T1491XA Suicide attempt, initial encounter: Secondary | ICD-10-CM | POA: Diagnosis not present

## 2020-09-12 NOTE — Progress Notes (Signed)
[facility] PROGRESS NOTE    Progress Note  Dalton Harris  IEP:[id number] DOB: 01/04/1961 DOA: 09/08/2020 PCP: Pcp, No     Brief Narrative:   Dalton Harris is an 60 y.o. male past medical history significant for CVA polysubstance abuse hyperlipidemia brought in due to a drug overdose, he relates his overall quality daughter aquatics #35 pills of once is a plan of ending all brought into the ED somnolent with some respiratory depression given Narcan and placed on Narcan drip  He is medically stable to be transferred to inpatient psychiatric facility.  Assessment/Plan:   Suicide attempt probably narcotic overdose: UDS negative for opiates. He took 35 pills unknown quantity, for which she responded to the Narcan drip now he is off Narcan drip. Psychiatry recommended inpatient psychiatric admission  Essential hypertension: Blood pressure seems to be well controlled, will hold antihypertensive medication  Polysubstance abuse: He is positive for amphetamines cocaine and marijuana.  Sinus bradycardia: Probably due to chronic cocaine abuse.  COPD: Noted.  Hyperlipidemia: Continue statins.    DVT prophylaxis: lovenox Family Communication:none Status is: Inpatient  Remains inpatient appropriate because:Hemodynamically unstable  Dispo: The patient is from: Home              Anticipated d/c is to: Home              Patient currently is not medically stable to d/c.   Difficult to place patient No        Code Status:     Code Status Orders  (From admission, onward)           Start     Ordered   09/08/20 2038  Full code  Continuous        09/08/20 2037           Code Status History     This patient has a current code status but no historical code status.         IV Access:   Peripheral IV   Procedures and diagnostic studies:   No results found.   Medical Consultants:   None.   Subjective:    Dalton Harris does not  want to be bothered  Objective:    Vitals:   09/11/20 0423 09/11/20 1359 09/11/20 2049 09/12/20 0333  BP: 121/66 138/73 (!) 134/96 (!) 108/55  Pulse: (!) 46 60 60 (!) 53  Resp: 18 16 20 19   Temp: 97.7 F (36.5 C) 97.8 F (36.6 C) 98.1 F (36.7 C) 97.8 F (36.6 C)  TempSrc: Oral Oral Oral Oral  SpO2: 99% 99%     SpO2: 99 %   Intake/Output Summary (Last 24 hours) at 09/12/2020 0813 Last data filed at 09/11/2020 1831 Gross per 24 hour  Intake 3960 ml  Output --  Net 3960 ml    There were no vitals filed for this visit.  Exam: Refused physical exam  Data Reviewed:    Labs: Basic Metabolic Panel: Recent Labs  Lab 09/08/20 1627 09/08/20 1634 09/08/20 2043 09/09/20 0339  NA 137 140  --  139  K 3.3* 3.4*  --  3.9  CL 105 103  --  107  CO2 25  --   --  26  GLUCOSE 101* 103*  --  112*  BUN 8 5*  --  7  CREATININE 0.63 0.70 0.80 0.71  CALCIUM 8.4*  --   --  8.7*  MG 1.9  --   --   --     GFR  CrCl cannot be calculated (Unknown ideal weight.). Liver Function Tests: Recent Labs  Lab 09/08/20 1627 09/09/20 0339  AST 17 15  ALT 13 12  ALKPHOS 47 44  BILITOT 0.7 0.9  PROT 5.6* 5.5*  ALBUMIN 3.4* 3.2*    No results for input(s): LIPASE, AMYLASE in the last 168 hours. No results for input(s): AMMONIA in the last 168 hours. Coagulation profile No results for input(s): INR, PROTIME in the last 168 hours. COVID-19 Labs  No results for input(s): DDIMER, FERRITIN, LDH, CRP in the last 72 hours.  Lab Results  Component Value Date   SARSCOV2NAA NEGATIVE 09/08/2020   SARSCOV2NAA NEGATIVE 07/10/2020    CBC: Recent Labs  Lab 09/08/20 1627 09/08/20 1634 09/08/20 2043 09/09/20 0339  WBC 8.9  --  8.7 6.2  NEUTROABS 6.3  --   --   --   HGB 12.4* 11.6* 12.8* 12.8*  HCT 36.4* 34.0* 38.2* 37.9*  MCV 86.9  --  87.8 89.2  PLT 291  --  286 263    Cardiac Enzymes: No results for input(s): CKTOTAL, CKMB, CKMBINDEX, TROPONINI in the last 168 hours. BNP (last  3 results) No results for input(s): PROBNP in the last 8760 hours. CBG: Recent Labs  Lab 09/08/20 1639  GLUCAP 100*    D-Dimer: No results for input(s): DDIMER in the last 72 hours. Hgb A1c: No results for input(s): HGBA1C in the last 72 hours. Lipid Profile: No results for input(s): CHOL, HDL, LDLCALC, TRIG, CHOLHDL, LDLDIRECT in the last 72 hours. Thyroid function studies: No results for input(s): TSH, T4TOTAL, T3FREE, THYROIDAB in the last 72 hours.  Invalid input(s): FREET3 Anemia work up: No results for input(s): VITAMINB12, FOLATE, FERRITIN, TIBC, IRON, RETICCTPCT in the last 72 hours. Sepsis Labs: Recent Labs  Lab 09/08/20 1627 09/08/20 2043 09/09/20 0339  WBC 8.9 8.7 6.2    Microbiology Recent Results (from the past 240 hour(s))  Resp Panel by RT-PCR (Flu A&B, Covid) Nasopharyngeal Swab     Status: None   Collection Time: 09/08/20  8:55 PM   Specimen: Nasopharyngeal Swab; Nasopharyngeal(NP) swabs in vial transport medium  Result Value Ref Range Status   SARS Coronavirus 2 by RT PCR NEGATIVE NEGATIVE Final    Comment: (NOTE) SARS-CoV-2 target nucleic acids are NOT DETECTED.  The SARS-CoV-2 RNA is generally detectable in upper respiratory specimens during the acute phase of infection. The lowest concentration of SARS-CoV-2 viral copies this assay can detect is 138 copies/mL. A negative result does not preclude SARS-Cov-2 infection and should not be used as the sole basis for treatment or other patient management decisions. A negative result may occur with  improper specimen collection/handling, submission of specimen other than nasopharyngeal swab, presence of viral mutation(s) within the areas targeted by this assay, and inadequate number of viral copies(<138 copies/mL). A negative result must be combined with clinical observations, patient history, and epidemiological information. The expected result is Negative.  Fact Sheet for Patients:   BloggerCourse.com  Fact Sheet for Healthcare Providers:  SeriousBroker.it  This test is no t yet approved or cleared by the [place] FDA and  has been authorized for detection and/or diagnosis of SARS-CoV-2 by FDA under an Emergency Use Authorization (EUA). This EUA will remain  in effect (meaning this test can be used) for the duration of the COVID-19 declaration under Section 564(b)(1) of the Act, 21 U.S.C.section 360bbb-3(b)(1), unless the authorization is terminated  or revoked sooner.       Influenza A by PCR NEGATIVE  NEGATIVE Final   Influenza B by PCR NEGATIVE NEGATIVE Final    Comment: (NOTE) The Xpert Xpress SARS-CoV-2/FLU/RSV plus assay is intended as an aid in the diagnosis of influenza from Nasopharyngeal swab specimens and should not be used as a sole basis for treatment. Nasal washings and aspirates are unacceptable for Xpert Xpress SARS-CoV-2/FLU/RSV testing.  Fact Sheet for Patients: BloggerCourse.com  Fact Sheet for Healthcare Providers: SeriousBroker.it  This test is not yet approved or cleared by the [place] FDA and has been authorized for detection and/or diagnosis of SARS-CoV-2 by FDA under an Emergency Use Authorization (EUA). This EUA will remain in effect (meaning this test can be used) for the duration of the COVID-19 declaration under Section 564(b)(1) of the Act, 21 U.S.C. section 360bbb-3(b)(1), unless the authorization is terminated or revoked.  Performed at [facility], [address], [place], [place] [address]      Medications:    enoxaparin (LOVENOX) injection  40 mg Subcutaneous Q24H   nicotine  21 mg Transdermal Daily   Continuous Infusions:  dextrose 5% lactated ringers with KCl 20 mEq/L 10 mL/hr at 09/09/20 0736      LOS: 4 days   Marinda Elk  [facility]  09/12/2020, 8:13 AM

## 2020-09-12 NOTE — TOC Initial Note (Signed)
Transition of Care [facility]) - Initial/Assessment Note    Patient Details  Name: Dalton Harris MRN: [id number] Date of Birth: 01/10/1961  Transition of [facility]) CM/SW Contact:    Ida Rogue, LCSW Phone Number: 09/12/2020, 8:45 AM  Clinical Narrative:    Sent secure chat message to [facility], [facility] requesting review for possible placement for medically stable.  Awaiting response. TOC will continue to follow during the course of hospitalization.                Expected Discharge Plan: Psychiatric Hospital Barriers to Discharge: Psych Bed not available   Patient Goals and CMS Choice        Expected Discharge Plan and Services Expected Discharge Plan: Psychiatric Hospital                                              Prior Living Arrangements/Services                       Activities of Daily Living Home Assistive Devices/Equipment: Eyeglasses ADL Screening (condition at time of admission) Patient's cognitive ability adequate to safely complete daily activities?: Yes Is the patient deaf or have difficulty hearing?: No Does the patient have difficulty seeing, even when wearing glasses/contacts?: No Does the patient have difficulty concentrating, remembering, or making decisions?: No Patient able to express need for assistance with ADLs?: Yes Does the patient have difficulty dressing or bathing?: No Independently performs ADLs?: Yes (appropriate for developmental age) Does the patient have difficulty walking or climbing stairs?: No Weakness of Legs: Both Weakness of Arms/Hands: Both  Permission Sought/Granted                  Emotional Assessment              Admission diagnosis:  Suicide attempt (HCC) [T14.91XA] Narcotic overdose (HCC) [T40.601A] Intentional opiate overdose, initial encounter [facility]) [T40.602A] Patient Active Problem List   Diagnosis Date Noted   Suicide attempt (HCC) 09/08/2020   Essential hypertension 09/08/2020    Hyperlipemia 09/08/2020   COPD (chronic obstructive pulmonary disease) (HCC) 09/08/2020   Narcotic overdose (HCC) 09/08/2020   PCP:  Oneita Hurt No Pharmacy:   [id number] - [place], [place] - [address] [place] [address] Phone: [phone] Fax: [phone]     Social Determinants of Health (SDOH) Interventions    Readmission Risk Interventions No flowsheet data found.

## 2020-09-12 NOTE — Consult Note (Signed)
[facility] Face-to-Face Psychiatry Consult   Reason for Consult:  Suicide attempt Referring Physician:  Marinda Elk MD Patient Identification: Dalton Harris MRN:  [id number] Principal Diagnosis: <principal problem not specified> Diagnosis:  Active Problems:   Suicide attempt [facility])   Essential hypertension   Hyperlipemia   COPD (chronic obstructive pulmonary disease) (HCC)   Narcotic overdose (HCC)   Total Time spent with patient: 30 minutes  Subjective:   Dalton Harris is a 60 y.o. male patient with a history of CVA, polysubstance abuse (reports regular crack cocaine and marijuana use )admitted with drug overdose on 8/11. UDS+THC, amphetamines, and cocaine. "  I was just trying to get myself out of the situation, there was a calculated risk but I take it anyway.It was not a suicide attempt. " Patient denies any history of suicide attempts. He reports that he misuses illicit substaces all the time, and his thinking ability is usually impaired by these substances. He reports recently he started using crack, powder, and marijuana. "  That have been known to use substances ice, meth, crack, and he is to take about 10 oxycodones a day. " He reports on this most recent attempt that he took 10 oxycodones and took 10 more as the first 10 were not working. He denies this as a suuicide attempt and more so" a distraction to avoid getting out of trouble with two drug dealers. They thought I was dead."  He reports he is currently on parole at this time, as he was recently released from prison after spending almost 33 years.   Patient is seen and assessed by this nurse practitioner.  Case is discussed with Dr. Lucianne Muss, in addition to treatment team.  On today's evaluation patient describes his mood as good.  He endorses a good mood as related to" hopefully going home. I feel a lot better than I did when I came in here."  He denies any depressive symptoms, anxiety.  When assessing for anger, and or mood  swings he states" not right now."  He  further reports not having any anger, agitation, anger or irritability during this hospitalization.  He endorses a good sleep, as well as good appetite.   At this time he denies suicidal ideations, homicidal ideations, and or auditory or visual hallucinations.  He is able to contract for safety, and appears psychiatrically stable to discharge home with outpatient resources.   HPI:   Dalton Harris is an 60 y.o. male past medical history significant for CVA polysubstance abuse hyperlipidemia brought in due to a drug overdose, he relates his overall quality daughter aquatics #35 pills of once is a plan of ending all brought into the ED somnolent with some respiratory depression given Narcan and placed on Narcan drip    Past Psychiatric History: denies  Risk to Self:  yes Risk to Others:  no Prior Inpatient Therapy:  denies Prior Outpatient Therapy:  denies  Past Medical History:  Past Medical History:  Diagnosis Date   Ischemic stroke (HCC)    No past surgical history on file. Family History: No family history on file. Family Psychiatric  History: unknwon Social History:  Social History   Substance and Sexual Activity  Alcohol Use None     Social History   Substance and Sexual Activity  Drug Use Not on file    Social History   Socioeconomic History   Marital status: Single    Spouse name: Not on file   Number of children: Not on file  Years of education: Not on file   Highest education level: Not on file  Occupational History   Not on file  Tobacco Use   Smoking status: Never   Smokeless tobacco: Never  Substance and Sexual Activity   Alcohol use: Not on file   Drug use: Not on file   Sexual activity: Not on file  Other Topics Concern   Not on file  Social History Narrative   Not on file   Social Determinants of Health   Financial Resource Strain: Not on file  Food Insecurity: Not on file  Transportation Needs: Not on  file  Physical Activity: Not on file  Stress: Not on file  Social Connections: Not on file   Additional Social History:    Allergies:   Allergies  Allergen Reactions   Broccoli [Brassica Oleracea] Anaphylaxis   Novocain [Procaine] Anaphylaxis    Labs:  No results found for this or any previous visit (from the past 48 hour(s)).   Current Facility-Administered Medications  Medication Dose Route Frequency Provider Last Rate Last Admin   acetaminophen (TYLENOL) tablet 650 mg  650 mg Oral Q6H PRN Rometta Emery, MD       Or   acetaminophen (TYLENOL) suppository 650 mg  650 mg Rectal Q6H PRN Earlie Lou L, MD       dextrose 5% in lactated ringers with KCl 20 mEq/L infusion   Intravenous Continuous Marinda Elk, MD 10 mL/hr at 09/09/20 0736 Rate Change at 09/09/20 0736   enoxaparin (LOVENOX) injection 40 mg  40 mg Subcutaneous Q24H Earlie Lou L, MD   40 mg at 09/11/20 2046   nicotine (NICODERM CQ - dosed in mg/24 hours) patch 21 mg  21 mg Transdermal Daily Earlie Lou L, MD   21 mg at 09/11/20 1032   ondansetron (ZOFRAN) tablet 4 mg  4 mg Oral Q6H PRN Rometta Emery, MD       Or   ondansetron (ZOFRAN) injection 4 mg  4 mg Intravenous Q6H PRN Rometta Emery, MD        Musculoskeletal: Strength & Muscle Tone: within normal limits Gait & Station: normal Patient leans: N/A    Psychiatric Specialty Exam:  Presentation  General Appearance: Appropriate for Environment; Casual  Eye Contact:Good  Speech:Clear and Coherent; Normal Rate  Speech Volume:Normal  Handedness: Right  Mood and Affect  Mood:Euthymic  Affect:Blunt; Appropriate   Thought Process  Thought Processes:Coherent; Linear  Descriptions of Associations:Intact  Orientation:Full (Time, Place and Person)  Thought Content: paranoid  History of Schizophrenia/Schizoaffective disorder:No data recorded Duration of Psychotic Symptoms:No data  recorded Hallucinations:Hallucinations: None  Ideas of Reference:None  Suicidal Thoughts:Suicidal Thoughts: No  Homicidal Thoughts:Homicidal Thoughts: No   Sensorium  Memory:Immediate Good; Recent Good; Remote Good  Judgment:Fair  Insight:Fair   Executive Functions  Concentration: Fair Attention Span:Fair  Recall:Fair  Fund of Knowledge:Fair  Language:Fair   Psychomotor Activity  Psychomotor Activity:Psychomotor Activity: Normal   Assets  Assets:Communication Skills; Financial Resources/Insurance; Housing; Leisure Time; Physical Health; Resilience; Social Support   Sleep  Sleep:Sleep: Fair   Physical Exam Constitutional:      Appearance: Normal appearance. He is normal weight.  Eyes:     Extraocular Movements: Extraocular movements intact.  Pulmonary:     Effort: Pulmonary effort is normal.  Neurological:     Mental Status: He is alert.  Psychiatric:        Attention and Perception: Attention and perception normal.        Mood  and Affect: Mood and affect normal.        Speech: Speech normal.        Behavior: Behavior normal. Behavior is cooperative.        Thought Content: Thought content normal.        Cognition and Memory: Cognition normal.        Judgment: Judgment normal.   Review of Systems  Psychiatric/Behavioral:  Positive for substance abuse. Negative for depression, hallucinations, memory loss and suicidal ideas. The patient is not nervous/anxious and does not have insomnia.   All other systems reviewed and are negative. Blood pressure (!) 108/55, pulse (!) 53, temperature 97.8 F (36.6 C), temperature source Oral, resp. rate 19, SpO2 99 %. There is no height or weight on file to calculate BMI.  Treatment Plan Summary:   60 yo male with h/o polysubstance abuse who presents to the ED after reported  intentional overdose of oxycodone, patient responded to Narcan drip.  Ironically his urine drug screen was negative for opiates, although it  was positive for cocaine, amphetamines, and THC which patient does admit recent use of.  He states he has not used opiates in several weeks.,  Up until he took about 20 pills.  Patient admits to overusing multiple substances "I was just trying to get high ."  On my evaluation, he continues to deny suicidal ideation and denies any previous suicide attempts.  He denies any previous psychiatric history, with the exception of substance abuse which has been ongoing.  He reports recently being released from prison after a 32-year sentence for burglary and breaking and entering.  He reports his parole officer has now found him a substance abuse place, in which he reports to on Thursday.    Disposition: No evidence of imminent risk to self or others at present.   Patient does not meet criteria for psychiatric inpatient admission. Supportive therapy provided about ongoing stressors. Refer to IOP. Discussed crisis plan, support from social network, calling 911, coming to the Emergency Department, and calling Suicide Hotline.  Maryagnes Amos, FNP
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
## 2020-09-13 NOTE — Plan of Care (Signed)
  Problem: Education: Goal: Knowledge of General Education information will improve Description: Including pain rating scale, medication(s)/side effects and non-pharmacologic comfort measures Outcome: Adequate for Discharge   Problem: Health Behavior/Discharge Planning: Goal: Ability to manage health-related needs will improve Outcome: Adequate for Discharge   Problem: Clinical Measurements: Goal: Will remain free from infection Outcome: Adequate for Discharge Goal: Diagnostic test results will improve Outcome: Adequate for Discharge Goal: Respiratory complications will improve Outcome: Adequate for Discharge Goal: Cardiovascular complication will be avoided Outcome: Adequate for Discharge   Problem: Activity: Goal: Risk for activity intolerance will decrease Outcome: Adequate for Discharge   Problem: Nutrition: Goal: Adequate nutrition will be maintained Outcome: Adequate for Discharge   Problem: Coping: Goal: Level of anxiety will decrease Outcome: Adequate for Discharge   Problem: Elimination: Goal: Will not experience complications related to bowel motility Outcome: Adequate for Discharge Goal: Will not experience complications related to urinary retention Outcome: Adequate for Discharge   Problem: Pain Managment: Goal: General experience of comfort will improve Outcome: Adequate for Discharge   Problem: Safety: Goal: Ability to remain free from injury will improve Outcome: Adequate for Discharge   Problem: Skin Integrity: Goal: Risk for impaired skin integrity will decrease Outcome: Adequate for Discharge   

## 2020-11-21 ENCOUNTER — Emergency Department (HOSPITAL_COMMUNITY): Payer: Medicaid Other

## 2020-11-21 ENCOUNTER — Emergency Department (HOSPITAL_COMMUNITY)
Admission: EM | Admit: 2020-11-21 | Discharge: 2020-11-22 | Disposition: A | Discharge status: Home / Self Care | Payer: Medicaid Other | Attending: Emergency Medicine | Admitting: Emergency Medicine

## 2020-11-21 ENCOUNTER — Other Ambulatory Visit: Payer: Self-pay

## 2020-11-21 DIAGNOSIS — R079 Chest pain, unspecified: Secondary | ICD-10-CM

## 2020-11-21 DIAGNOSIS — J449 Chronic obstructive pulmonary disease, unspecified: Secondary | ICD-10-CM | POA: Diagnosis not present

## 2020-11-21 DIAGNOSIS — Z79899 Other long term (current) drug therapy: Secondary | ICD-10-CM | POA: Insufficient documentation

## 2020-11-21 DIAGNOSIS — I10 Essential (primary) hypertension: Secondary | ICD-10-CM | POA: Insufficient documentation

## 2020-11-21 DIAGNOSIS — Z7901 Long term (current) use of anticoagulants: Secondary | ICD-10-CM | POA: Insufficient documentation

## 2020-11-21 DIAGNOSIS — R0602 Shortness of breath: Secondary | ICD-10-CM | POA: Diagnosis not present

## 2020-11-21 LAB — CBC
HCT: 40.4 % (ref 39.0–52.0)
Hemoglobin: 13.9 g/dL (ref 13.0–17.0)
MCH: 30.3 pg (ref 26.0–34.0)
MCHC: 34.4 g/dL (ref 30.0–36.0)
MCV: 88 fL (ref 80.0–100.0)
Platelets: 366 10*3/uL (ref 150–400)
RBC: 4.59 MIL/uL (ref 4.22–5.81)
RDW: 13.2 % (ref 11.5–15.5)
WBC: 10.4 10*3/uL (ref 4.0–10.5)
nRBC: 0 % (ref 0.0–0.2)

## 2020-11-21 LAB — BASIC METABOLIC PANEL
Anion gap: 8 (ref 5–15)
BUN: 12 mg/dL (ref 6–20)
CO2: 25 mmol/L (ref 22–32)
Calcium: 9 mg/dL (ref 8.9–10.3)
Chloride: 104 mmol/L (ref 98–111)
Creatinine, Ser: 1.04 mg/dL (ref 0.61–1.24)
GFR, Estimated: >60 mL/min (ref >60)
Glucose, Bld: 102 mg/dL — ABNORMAL HIGH (ref 70–99)
Potassium: 3.6 mmol/L (ref 3.5–5.1)
Sodium: 137 mmol/L (ref 135–145)

## 2020-11-21 LAB — TROPONIN I (HIGH SENSITIVITY): Troponin I (High Sensitivity): 3 ng/L (ref <18)
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
## 2020-11-21 NOTE — ED Notes (Addendum)
Step outside 

## 2020-11-22 LAB — TROPONIN I (HIGH SENSITIVITY): Troponin I (High Sensitivity): <2 ng/L (ref <18)

## 2020-11-22 NOTE — Discharge Instructions (Signed)
Your work-up today in the ED was very reassuring, no signs of heart attack or pneumonia.  Your blood counts are also reassuring.  Follow-up with your primary care doctor as needed, return to the ED if things change or worsen.

## 2020-11-22 NOTE — ED Provider Notes (Signed)
[facility] EMERGENCY DEPARTMENT Provider Note   CSN: [id number] Arrival date & time: 11/21/20  1541     History Chief Complaint  Patient presents with   Chest Pain    Dalton Harris is a 60 y.o. male.  HPI  Patient with history of COPD, narcotic abuse, hypertension, hyperlipidemia, current day smoker presents with chest pain.  Chest pain started acutely at 12 PM yesterday.  It lasted for roughly 3 hours, it was left-sided with radiation to the left arm.  Patient reports he felt short of breath, denies any nausea or vomiting.  Patient reports he is short of breath at baseline secondary to COPD.  The pain was not aggravated by anything, it was alleviated by time.    Past Medical History:  Diagnosis Date   Ischemic stroke [facility])     Patient Active Problem List   Diagnosis Date Noted   Suicide attempt [facility]) 09/08/2020   Essential hypertension 09/08/2020   Hyperlipemia 09/08/2020   COPD (chronic obstructive pulmonary disease) (HCC) 09/08/2020   Narcotic overdose (HCC) 09/08/2020    No past surgical history on file.     No family history on file.  Social History   Tobacco Use   Smoking status: Never   Smokeless tobacco: Never    Home Medications Prior to Admission medications   Medication Sig Start Date End Date Taking? Authorizing Provider  albuterol (VENTOLIN HFA) 108 (90 Base) MCG/ACT inhaler Inhale 2 puffs into the lungs every 6 (six) hours as needed for wheezing or shortness of breath.    [provider]  amLODipine (NORVASC) 5 MG tablet Take 5 mg by mouth daily. 07/01/20   [provider]  atorvastatin (LIPITOR) 80 MG tablet Take 80 mg by mouth at bedtime. 07/01/20   [provider]  clopidogrel (PLAVIX) 75 MG tablet Take 75 mg by mouth daily. 07/01/20   [provider]  fluticasone-salmeterol (ADVAIR) 100-50 MCG/ACT AEPB Inhale 1 puff into the lungs 2 (two) times daily.    [provider]     Allergies    Cy Blamer [place] oleracea] and Novocain [procaine]  Review of Systems   Review of Systems  HENT:  Negative for congestion.   Respiratory:  Positive for shortness of breath.   Cardiovascular:  Positive for chest pain.  Gastrointestinal:  Negative for nausea and vomiting.  Genitourinary:  Negative for dysuria.  Musculoskeletal:  Negative for gait problem.  Neurological:  Negative for light-headedness and numbness.   Physical Exam Updated Vital Signs BP (!) 160/71 (BP Location: Left Arm)   Pulse 62   Temp 98 F (36.7 C) (Oral)   Resp 16   SpO2 100%   Physical Exam Vitals and nursing note reviewed. Exam conducted with a chaperone present.  Constitutional:      Appearance: Normal appearance.  HENT:     Head: Normocephalic and atraumatic.  Eyes:     General: No scleral icterus.       Right eye: No discharge.        Left eye: No discharge.     Extraocular Movements: Extraocular movements intact.     Pupils: Pupils are equal, round, and reactive to light.  Cardiovascular:     Rate and Rhythm: Normal rate and regular rhythm.     Pulses: Normal pulses.     Heart sounds: Normal heart sounds. No murmur heard.   No friction rub. No gallop.  Pulmonary:     Effort: Pulmonary effort is normal. No respiratory distress.  Breath sounds: Normal breath sounds.     Comments: Speaking in complete sentences Abdominal:     General: Abdomen is flat. Bowel sounds are normal. There is no distension.     Palpations: Abdomen is soft.     Tenderness: There is no abdominal tenderness.  Skin:    General: Skin is warm and dry.     Coloration: Skin is not jaundiced.  Neurological:     Mental Status: He is alert. Mental status is at baseline.     Coordination: Coordination normal.    ED Results / Procedures / Treatments   Labs (all labs ordered are listed, but only abnormal results are displayed) Labs Reviewed  BASIC METABOLIC PANEL - Abnormal; Notable for the following  components:      Result Value   Glucose, Bld 102 (*)    All other components within normal limits  CBC  TROPONIN I (HIGH SENSITIVITY)  TROPONIN I (HIGH SENSITIVITY)    EKG EKG Interpretation  Date/Time:  Monday November 21 2020 15:59:19 EDT Ventricular Rate:  62 PR Interval:  150 QRS Duration: 94 QT Interval:  410 QTC Calculation: 416 R Axis:   73 Text Interpretation: Normal sinus rhythm Normal ECG Confirmed by Zadie Rhine ([id number]) on 11/22/2020 8:45:18 AM  Radiology DG Chest 2 View  Result Date: 11/21/2020 CLINICAL DATA:  Chest pain EXAM: CHEST - 2 VIEW COMPARISON:  Radiograph 12/18/2020 FINDINGS: Unchanged cardiomediastinal silhouette. There is no focal airspace consolidation. There is no large pleural effusion or visible pneumothorax. There is spondylosis. No acute osseous abnormality. IMPRESSION: No focal airspace disease. Electronically Signed   By: Caprice Renshaw M.D.   On: 11/21/2020 16:49    Procedures Procedures   Medications Ordered in ED Medications - No data to display  ED Course  I have reviewed the triage vital signs and the nursing notes.  Pertinent labs & imaging results that were available during my care of the patient were reviewed by me and considered in my medical decision making (see chart for details).    MDM Rules/Calculators/A&P                           Patient vitals vitals are stable, he is nontoxic-appearing.  His chest pain resolved independently greater than 12 hours ago, has not had a recurrence of chest pain.  Delta troponin negative, EKG without any acute findings.  Heart score is 4 due to risk factors, however his work-up is relatively benign.  No evidence of pneumonia, no leukocytosis noted be supportive of a pulmonary infection.  No AKI, no gross electrolyte derangement.  Cardiac monitoring has not shown [place] any arrhythmias.  Patient is tolerating oral intake well.   Do not think additional work-up is warranted at the emergent basis  based on her findings, his lack of cardiac history, resolution of pain without intervention, negative delta tropes and no EKG findings.  Patient discharged in stable position with outpatient follow-up.  Final Clinical Impression(s) / ED Diagnoses Final diagnoses:  None    Rx / DC Orders ED Discharge Orders     None        Theron Arista, [place] 11/22/20 1402    Linwood Dibbles, MD 11/24/20 1204

## 2021-02-17 ENCOUNTER — Emergency Department (HOSPITAL_COMMUNITY): Payer: Medicaid Other

## 2021-02-17 ENCOUNTER — Other Ambulatory Visit: Payer: Self-pay

## 2021-02-17 ENCOUNTER — Emergency Department (HOSPITAL_COMMUNITY)
Admission: EM | Admit: 2021-02-17 | Discharge: 2021-02-18 | Disposition: A | Discharge status: Home / Self Care | Payer: Medicaid Other | Attending: Emergency Medicine | Admitting: Emergency Medicine

## 2021-02-17 DIAGNOSIS — F1490 Cocaine use, unspecified, uncomplicated: Secondary | ICD-10-CM | POA: Insufficient documentation

## 2021-02-17 DIAGNOSIS — R531 Weakness: Secondary | ICD-10-CM

## 2021-02-17 DIAGNOSIS — I639 Cerebral infarction, unspecified: Secondary | ICD-10-CM | POA: Insufficient documentation

## 2021-02-17 DIAGNOSIS — Z79899 Other long term (current) drug therapy: Secondary | ICD-10-CM | POA: Insufficient documentation

## 2021-02-17 DIAGNOSIS — Z20822 Contact with and (suspected) exposure to covid-19: Secondary | ICD-10-CM | POA: Diagnosis not present

## 2021-02-17 DIAGNOSIS — R4781 Slurred speech: Secondary | ICD-10-CM | POA: Diagnosis not present

## 2021-02-17 DIAGNOSIS — R911 Solitary pulmonary nodule: Secondary | ICD-10-CM

## 2021-02-17 DIAGNOSIS — R519 Headache, unspecified: Secondary | ICD-10-CM | POA: Insufficient documentation

## 2021-02-17 DIAGNOSIS — R0781 Pleurodynia: Secondary | ICD-10-CM | POA: Diagnosis not present

## 2021-02-17 LAB — COMPREHENSIVE METABOLIC PANEL
ALT: 16 U/L (ref 0–44)
AST: 35 U/L (ref 15–41)
Albumin: 3.5 g/dL (ref 3.5–5.0)
Alkaline Phosphatase: 79 U/L (ref 38–126)
Anion gap: 9 (ref 5–15)
BUN: 15 mg/dL (ref 6–20)
CO2: 23 mmol/L (ref 22–32)
Calcium: 9 mg/dL (ref 8.9–10.3)
Chloride: 104 mmol/L (ref 98–111)
Creatinine, Ser: 0.92 mg/dL (ref 0.61–1.24)
GFR, Estimated: >60 mL/min (ref >60)
Glucose, Bld: 95 mg/dL (ref 70–99)
Potassium: 5 mmol/L (ref 3.5–5.1)
Sodium: 136 mmol/L (ref 135–145)
Total Bilirubin: 1.4 mg/dL — ABNORMAL HIGH (ref 0.3–1.2)
Total Protein: 5.7 g/dL — ABNORMAL LOW (ref 6.5–8.1)

## 2021-02-17 LAB — CBC
HCT: 40 % (ref 39.0–52.0)
Hemoglobin: 13.3 g/dL (ref 13.0–17.0)
MCH: 29.8 pg (ref 26.0–34.0)
MCHC: 33.3 g/dL (ref 30.0–36.0)
MCV: 89.7 fL (ref 80.0–100.0)
Platelets: 306 10*3/uL (ref 150–400)
RBC: 4.46 MIL/uL (ref 4.22–5.81)
RDW: 14 % (ref 11.5–15.5)
WBC: 8.7 10*3/uL (ref 4.0–10.5)
nRBC: 0 % (ref 0.0–0.2)

## 2021-02-17 LAB — DIFFERENTIAL
Abs Immature Granulocytes: 0.02 10*3/uL (ref 0.00–0.07)
Basophils Absolute: 0.1 10*3/uL (ref 0.0–0.1)
Basophils Relative: 1 %
Eosinophils Absolute: 0.2 10*3/uL (ref 0.0–0.5)
Eosinophils Relative: 2 %
Immature Granulocytes: 0 %
Lymphocytes Relative: 35 %
Lymphs Abs: 3.1 10*3/uL (ref 0.7–4.0)
Monocytes Absolute: 0.8 10*3/uL (ref 0.1–1.0)
Monocytes Relative: 9 %
Neutro Abs: 4.6 10*3/uL (ref 1.7–7.7)
Neutrophils Relative %: 53 %

## 2021-02-17 LAB — I-STAT CHEM 8, ED
BUN: 19 mg/dL (ref 6–20)
Calcium, Ion: 1.07 mmol/L — ABNORMAL LOW (ref 1.15–1.40)
Chloride: 104 mmol/L (ref 98–111)
Creatinine, Ser: 0.9 mg/dL (ref 0.61–1.24)
Glucose, Bld: 91 mg/dL (ref 70–99)
HCT: 40 % (ref 39.0–52.0)
Hemoglobin: 13.6 g/dL (ref 13.0–17.0)
Potassium: 5.1 mmol/L (ref 3.5–5.1)
Sodium: 137 mmol/L (ref 135–145)
TCO2: 27 mmol/L (ref 22–32)

## 2021-02-17 LAB — RESP PANEL BY RT-PCR (FLU A&B, COVID) ARPGX2
Influenza A by PCR: NEGATIVE
Influenza B by PCR: NEGATIVE
SARS Coronavirus 2 by RT PCR: NEGATIVE

## 2021-02-17 LAB — PROTIME-INR
INR: 1.1 (ref 0.8–1.2)
Prothrombin Time: 14.3 seconds (ref 11.4–15.2)

## 2021-02-17 LAB — ETHANOL: Alcohol, Ethyl (B): <10 mg/dL (ref <10)

## 2021-02-17 LAB — APTT: aPTT: 36 seconds (ref 24–36)

## 2021-02-17 LAB — CBG MONITORING, ED: Glucose-Capillary: 95 mg/dL (ref 70–99)

## 2021-02-17 MED ORDER — METOCLOPRAMIDE HCL 5 MG/ML IJ SOLN
10.0000 mg | Freq: Once | INTRAMUSCULAR | Status: AC
  Administered 2021-02-17: 10 mg via INTRAVENOUS
  Filled 2021-02-17: qty 2

## 2021-02-17 MED ORDER — DIPHENHYDRAMINE HCL 50 MG/ML IJ SOLN
25.0000 mg | Freq: Once | INTRAMUSCULAR | Status: AC
  Administered 2021-02-17: 25 mg via INTRAVENOUS
  Filled 2021-02-17: qty 1

## 2021-02-17 MED ORDER — SODIUM CHLORIDE 0.9 % IV BOLUS
1000.0000 mL | Freq: Once | INTRAVENOUS | Status: AC
  Administered 2021-02-17: 1000 mL via INTRAVENOUS

## 2021-02-17 MED ORDER — KETOROLAC TROMETHAMINE 15 MG/ML IJ SOLN
15.0000 mg | Freq: Once | INTRAMUSCULAR | Status: AC
  Administered 2021-02-17: 15 mg via INTRAVENOUS
  Filled 2021-02-17: qty 1

## 2021-02-17 MED ORDER — IOHEXOL 350 MG/ML SOLN
100.0000 mL | Freq: Once | INTRAVENOUS | Status: AC | PRN
  Administered 2021-02-17: 100 mL via INTRAVENOUS

## 2021-02-17 MED ORDER — SODIUM CHLORIDE 0.9% FLUSH: 3.0000 mL | Freq: Once | INTRAVENOUS | Status: DC

## 2021-02-17 NOTE — Code Documentation (Signed)
Responded to Code Stroke called at 2010 for facial droop, slurred speech, R sided weakness, LSN-[id number]. Pt arrived at 2026, LSN changed to yesterday, CBG-95, NIH-10, CT head negative for acute changes, CTA-no LVO. TNK not given-outside window. Plan MRI.

## 2021-02-17 NOTE — ED Notes (Signed)
Pt provided with water and lunch bag.

## 2021-02-17 NOTE — ED Provider Notes (Signed)
[facility] EMERGENCY DEPARTMENT Provider Note   CSN: [id number] Arrival date & time: 02/17/21  2026  An emergency department physician performed an initial assessment on this suspected stroke patient at 2026.  History  Chief Complaint  Patient presents with   Code Stroke    Dalton Harris is a 61 y.o. male.  HPI 61 year old male presents as a code stroke.  History is primarily from EMS at the beginning.  The patient reportedly fell yesterday and injured his head.  He also did cocaine yesterday.  Today he was last seen normal at 6:30 PM according to roommates, who called EMS.  But then when he was walking the dog he fell and had right-sided weakness.  Patient tells neurologist when he first arrived that he has had some weakness on the right arm since yesterday.  He has been having slurred speech with EMS.  He did apparently do cocaine again today.  He denies chest pain.  Some headache.  Further history from patient after initial CT imaging and COVID stroke work-up shows that he has used cocaine, marijuana and alcohol over the last 2 days.  He is not sure what day it is currently but he does know that is January 2023.  He states he fell and injured his chest yesterday as well and his ribs are sore on the right side.  Home Medications Prior to Admission medications   Not on File      Allergies    Broccoli [brassica oleracea], Novocain [procaine], and Morphine and related    Review of Systems   Review of Systems  Unable to perform ROS: Acuity of condition   Physical Exam Updated Vital Signs BP 118/89    Pulse (!) 59    Temp 98.3 F (36.8 C) (Oral)    Resp (!) 21    Wt 69.4 kg    SpO2 100%    BMI 21.34 kg/m  Physical Exam Vitals and nursing note reviewed.  Constitutional:      Appearance: He is well-developed.  HENT:     Head: Normocephalic and atraumatic.  Cardiovascular:     Rate and Rhythm: Normal rate and regular rhythm.     Pulses:          Radial  pulses are 2+ on the right side.       Posterior tibial pulses are 2+ on the right side.     Heart sounds: Normal heart sounds.  Pulmonary:     Effort: Pulmonary effort is normal.     Breath sounds: Normal breath sounds.  Abdominal:     Palpations: Abdomen is soft.     Tenderness: There is no abdominal tenderness.  Skin:    General: Skin is warm and dry.  Neurological:     Mental Status: He is alert.     Comments: Awake, alert, oriented to person, month, year. 5/5 strength in LUE, LLE. Cannot lift RUE, RLE off stretcher. Weakly grips right. Reports no sensation in right arm and leg    ED Results / Procedures / Treatments   Labs (all labs ordered are listed, but only abnormal results are displayed) Labs Reviewed  COMPREHENSIVE METABOLIC PANEL - Abnormal; Notable for the following components:      Result Value   Total Protein 5.7 (*)    Total Bilirubin 1.4 (*)    All other components within normal limits  I-STAT CHEM 8, ED - Abnormal; Notable for the following components:   Calcium, Ion 1.07 (*)  All other components within normal limits  RESP PANEL BY RT-PCR (FLU A&B, COVID) ARPGX2  PROTIME-INR  APTT  CBC  DIFFERENTIAL  ETHANOL  RAPID URINE DRUG SCREEN, HOSP PERFORMED  URINALYSIS, ROUTINE W REFLEX MICROSCOPIC  CBG MONITORING, ED    EKG EKG Interpretation  Date/Time:  Friday February 17 2021 20:51:50 EST Ventricular Rate:  55 PR Interval:  160 QRS Duration: 81 QT Interval:  431 QTC Calculation: 413 R Axis:   79 Text Interpretation: Sinus rhythm  ST elevations are similar to Oct 2022 Confirmed by Pricilla Loveless [phone]) on 02/17/2021 9:16:03 PM  Radiology CT HEAD CODE STROKE WO CONTRAST  Result Date: 02/17/2021 CLINICAL DATA:  Code stroke. Initial evaluation for acute neuro deficit, stroke suspected. EXAM: CT HEAD WITHOUT CONTRAST TECHNIQUE: Contiguous axial images were obtained from the base of the skull through the vertex without intravenous contrast. RADIATION  DOSE REDUCTION: This exam was performed according to the departmental dose-optimization program which includes automated exposure control, adjustment of the mA and/or kV according to patient size and/or use of iterative reconstruction technique. COMPARISON:  Prior CT from 07/10/2020. FINDINGS: Brain: Age-related cerebral atrophy with chronic microvascular ischemic disease. No acute intracranial hemorrhage. No acute large vessel territory infarct. No mass lesion or midline shift. No hydrocephalus or extra-axial fluid collection. Vascular: No hyperdense vessel. Calcified atherosclerosis present at the skull base. Skull: Scalp soft tissues and calvarium within normal limits. Sinuses/Orbits: Left gaze noted. Paranasal sinuses and mastoid air cells are clear. Other: None. ASPECTS [facility] Stroke Program Early CT Score) - Ganglionic level infarction (caudate, lentiform nuclei, internal capsule, insula, M1-M3 cortex): 7 - Supraganglionic infarction (M4-M6 cortex): 3 Total score (0-10 with 10 being normal): 10 IMPRESSION: 1. No acute intracranial abnormality. 2. ASPECTS is 10. 3. Chronic microvascular ischemic disease, stable. These results were communicated to Dr. Amada Jupiter at 8:44 pm on 02/17/2021 by text page via the [facility] messaging system. Electronically Signed   By: Rise Mu M.D.   On: 02/17/2021 20:45    Procedures Procedures    Medications Ordered in ED Medications  sodium chloride flush (NS) 0.9 % injection 3 mL (3 mLs Intravenous Not Given 02/17/21 2053)  sodium chloride 0.9 % bolus 1,000 mL (0 mLs Intravenous Stopped 02/17/21 2206)  iohexol (OMNIPAQUE) 350 MG/ML injection 100 mL (100 mLs Intravenous Contrast Given 02/17/21 2045)  metoCLOPramide (REGLAN) injection 10 mg (10 mg Intravenous Given 02/17/21 2337)  diphenhydrAMINE (BENADRYL) injection 25 mg (25 mg Intravenous Given 02/17/21 2337)  ketorolac (TORADOL) 15 MG/ML injection 15 mg (15 mg Intravenous Given 02/17/21 2337)    ED Course/  Medical Decision Making/ A&P                            When patient initially presented he had no movement or minimal movement in his right extremities.  However the exam was a little inconsistent.  Initial head CT has been personally reviewed and interpreted and shows no head bleed.  CTA and perfusion ordered by neurology which is unremarkable.  Patient will be made aware of the lung nodule seen.  Labs have been reviewed and interpreted and are benign, no acute emergent findings.  ECG shows no acute ischemia on my view.  Chart review shows that he was diagnosed with possible stroke and given tPA multiple years ago but then later determined to probably have conversion disorder.  Neurology is highly suspecting he has a conversion/factitious disorder today.  However given his continued weakness an  MRI was ordered after discussion with neuro.  After a couple hours however, he started moving his right side and states his symptoms have completely resolved.  He still has a headache and states he has "complicated migraines".  Discussed again with Dr. Amada JupiterKirkpatrick of neurology and he recommends discharge and we can skip MRI.  Highly unlikely to be stroke/TIA.  Patient was given headache medicine and is feeling better and is now stable for discharge home.        Final Clinical Impression(s) / ED Diagnoses Final diagnoses:  Right sided weakness    Rx / DC Orders ED Discharge Orders          Ordered    Ambulatory referral to Neurology       Comments: An appointment is requested in approximately: 2 weeks   02/18/21 0010              Pricilla LovelessGoldston, Dalton Sparkman, MD 02/18/21 [id number]

## 2021-02-17 NOTE — ED Triage Notes (Signed)
BIB [facility] form home. LKN 1860. Unable to name object. Right facial droop, right weakness, Right numbness. LVO 4. [place] train tracks. Hit head. No LOC. Took cocaine and THC yesterday and today. No BP meds in months. HA since yesterday. Larey Seat out of Product manager.   100% 128/64 56 Heart rate CBG 161

## 2021-02-17 NOTE — Consult Note (Signed)
Neurology Consultation Reason for Consult: Right-sided weakness Referring Physician: Alric Ran  CC: Right-sided weakness  History is obtained from: Patient  HPI: Dalton Harris is a 61 y.o. male with a history of drug abuse as well as multiple previous presentations with unilateral weakness felt to be most consistent with conversion disorder.  He has been given tPA in the past for what was later felt to be psychogenic symptoms.  He was in a normal state of health yesterday when he fell and hit his head.  Since that time he has had some headache and some right arm weakness.  The weakness got prominently worse today with leg involvement and therefore he was brought in as a code stroke.  He reports using cocaine both yesterday and today.   LKW: Yesterday tpa given?: no, outside window   ROS: A 14 point ROS was performed and is negative except as noted in the HPI.  Past Medical History:  Diagnosis Date   Ischemic stroke (HCC)      No family history on file.   Social History:  reports that he has never smoked. He has never used smokeless tobacco. No history on file for alcohol use and drug use.   Exam: Current vital signs: Wt 69.4 kg    BMI 21.34 kg/m  Vital signs in last 24 hours: Weight:  [69.4 kg] 69.4 kg (01/20 2000)   Physical Exam  Constitutional: Appears well-developed and well-nourished.  Psych: Affect appropriate to situation Eyes: No scleral injection HENT: No OP obstruction MSK: no joint deformities.  Cardiovascular: Normal rate and regular rhythm.  Respiratory: Effort normal, non-labored breathing GI: Soft.  No distension. There is no tenderness.  Skin: WDI  Neuro: Mental Status: Patient is awake, alert, oriented to person, place, month, year, and situation. Patient is able to give a clear and coherent history. No signs of aphasia or neglect Cranial Nerves: II: Visual Fields are full. Pupils are equal, round, and reactive to light.   III,IV, VI:  EOMI without ptosis or diploplia.  V: Facial sensation is diminished on the right side of his face, including splitting the midline to vibration on the forehead VII: Facial movement with inconsistent right facial weakness VIII: hearing is intact to voice X: Uvula elevates symmetrically XI: Shoulder shrug is symmetric. XII: tongue is midline without atrophy or fasciculations.  Motor: Tone is normal. Bulk is normal. 5/5 strength was present on the left, on the right he has giveaway weakness Sensory: Sensation is diminished on the right Cerebellar: Does not perform     I have reviewed labs in epic and the results pertinent to this consultation are: Creatinine 0.9  I have reviewed the images obtained: CT head-negative, CTA-negative  Impression: 61 year old male with right-sided weakness.  He has some findings on physical exam concerning for psychogenic etiology.  The fact that he has been using cocaine so readily, does make me wonder about the possibility of actual symptoms with embellishment and therefore I do think an MRI would be useful in this situation.  If this is negative, then no further neurological work-up would be needed.  Recommendations: 1) MRI brain 2) if negative, no further work-up.   Ritta Slot, MD [facility] [phone]  If 7pm- 7am, please page neurology on call as listed in AMION.

## 2021-02-17 NOTE — Progress Notes (Signed)
°   02/17/21 2100  Clinical Encounter Type  Visited With Patient  Visit Type Initial  Referral From Nurse  Consult/Referral To None   Chaplain responded to a stroke alert and provided emotional support to patient.  Patient is talkative and concerned about his dog.  Chaplain advised nurse if patient requested a chaplain I would return.   Valerie Roys Chaplain Resident  [facility]  [phone]

## 2021-02-17 NOTE — ED Notes (Signed)
Pt took c-collar off and refusing to wear it.

## 2021-02-18 NOTE — Discharge Instructions (Addendum)
If you develop continued, recurrent, or worsening headache, fever, neck stiffness, vomiting, blurry or double vision, weakness or numbness in your arms or legs, trouble speaking, or any other new/concerning symptoms then return to the ER for evaluation.   Of note, your CT scan did show a lung nodule that we will need repeat CT scan about 3-6 months.  You will need a primary care physician to set this up.

## 2021-02-18 NOTE — ED Notes (Signed)
Patient verbalizes understanding of discharge instructions. Follow-up care reviewed. Opportunity for questioning and answers were provided. Armband removed by staff, pt discharged from ED ambulatory. Cab voucher provided to pt.

## 2022-06-04 ENCOUNTER — Encounter (HOSPITAL_COMMUNITY): Payer: Self-pay

## 2022-06-04 ENCOUNTER — Emergency Department (HOSPITAL_COMMUNITY)
Admission: EM | Admit: 2022-06-04 | Discharge: 2022-06-04 | Discharge status: Against Medical Advice | Payer: Medicaid Other | Attending: Emergency Medicine | Admitting: Emergency Medicine

## 2022-06-04 ENCOUNTER — Other Ambulatory Visit: Payer: Self-pay

## 2022-06-04 ENCOUNTER — Emergency Department (HOSPITAL_COMMUNITY): Payer: Medicaid Other

## 2022-06-04 DIAGNOSIS — D72829 Elevated white blood cell count, unspecified: Secondary | ICD-10-CM | POA: Diagnosis not present

## 2022-06-04 DIAGNOSIS — R1013 Epigastric pain: Secondary | ICD-10-CM

## 2022-06-04 LAB — BASIC METABOLIC PANEL
Anion gap: 15 (ref 5–15)
BUN: 6 mg/dL — ABNORMAL LOW (ref 8–23)
CO2: 20 mmol/L — ABNORMAL LOW (ref 22–32)
Calcium: 9.7 mg/dL (ref 8.9–10.3)
Chloride: 102 mmol/L (ref 98–111)
Creatinine, Ser: 1.06 mg/dL (ref 0.61–1.24)
GFR, Estimated: >60 mL/min (ref >60)
Glucose, Bld: 81 mg/dL (ref 70–99)
Potassium: 4.3 mmol/L (ref 3.5–5.1)
Sodium: 137 mmol/L (ref 135–145)

## 2022-06-04 LAB — HEPATIC FUNCTION PANEL
ALT: 15 U/L (ref 0–44)
AST: 22 U/L (ref 15–41)
Albumin: 4.4 g/dL (ref 3.5–5.0)
Alkaline Phosphatase: 66 U/L (ref 38–126)
Bilirubin, Direct: 0.2 mg/dL (ref 0.0–0.2)
Indirect Bilirubin: 0.7 mg/dL (ref 0.3–0.9)
Total Bilirubin: 0.9 mg/dL (ref 0.3–1.2)
Total Protein: 7.7 g/dL (ref 6.5–8.1)

## 2022-06-04 LAB — CBC
HCT: 46.2 % (ref 39.0–52.0)
Hemoglobin: 15.9 g/dL (ref 13.0–17.0)
MCH: 29.3 pg (ref 26.0–34.0)
MCHC: 34.4 g/dL (ref 30.0–36.0)
MCV: 85.1 fL (ref 80.0–100.0)
Platelets: 326 10*3/uL (ref 150–400)
RBC: 5.43 MIL/uL (ref 4.22–5.81)
RDW: 14 % (ref 11.5–15.5)
WBC: 13.8 10*3/uL — ABNORMAL HIGH (ref 4.0–10.5)
nRBC: 0 % (ref 0.0–0.2)

## 2022-06-04 LAB — TROPONIN I (HIGH SENSITIVITY)
Troponin I (High Sensitivity): 3 ng/L (ref <18)
Troponin I (High Sensitivity): 4 ng/L (ref <18)

## 2022-06-04 LAB — LIPASE, BLOOD: Lipase: 24 U/L (ref 11–51)

## 2022-06-04 MED ORDER — ONDANSETRON HCL 4 MG/2ML IJ SOLN
4.0000 mg | Freq: Once | INTRAMUSCULAR | Status: AC
Start: 2022-06-04 — End: 2022-06-04
  Administered 2022-06-04: 4 mg via INTRAVENOUS
  Filled 2022-06-04: qty 2

## 2022-06-04 MED ORDER — LORAZEPAM 2 MG/ML IJ SOLN
1.0000 mg | Freq: Once | INTRAMUSCULAR | Status: AC
  Administered 2022-06-04: 1 mg via INTRAVENOUS
  Filled 2022-06-04: qty 1

## 2022-06-04 MED ORDER — PANTOPRAZOLE SODIUM 40 MG IV SOLR
40.0000 mg | Freq: Once | INTRAVENOUS | Status: AC
  Administered 2022-06-04: 40 mg via INTRAVENOUS
  Filled 2022-06-04: qty 10

## 2022-06-04 MED ORDER — FENTANYL CITRATE PF 50 MCG/ML IJ SOSY
50.0000 ug | PREFILLED_SYRINGE | Freq: Once | INTRAMUSCULAR | Status: AC
  Administered 2022-06-04: 50 ug via INTRAVENOUS
  Filled 2022-06-04: qty 1
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
## 2022-06-04 NOTE — ED Triage Notes (Addendum)
Pt BIBGEMS from home with cp that started at 0830 dull with radiation to the left arm, consistent pain. SOB with some relief in oxygen. Pt reported smoking crack over the weekend, last use 0200. Pt reports increasing abdominal pain that radiates to the back with intermittent left arm numbness.   324 ASA  112/74 HR
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
## 2022-07-23 ENCOUNTER — Other Ambulatory Visit: Payer: Self-pay

## 2022-07-23 ENCOUNTER — Ambulatory Visit (HOSPITAL_COMMUNITY)
Admission: EM | Admit: 2022-07-23 | Discharge: 2022-07-24 | Disposition: A | Discharge status: Home / Self Care | Payer: Medicaid Other | Attending: Behavioral Health | Admitting: Behavioral Health

## 2022-07-23 DIAGNOSIS — R45851 Suicidal ideations: Secondary | ICD-10-CM | POA: Insufficient documentation

## 2022-07-23 DIAGNOSIS — Z9151 Personal history of suicidal behavior: Secondary | ICD-10-CM | POA: Insufficient documentation

## 2022-07-23 LAB — POCT URINE DRUG SCREEN - MANUAL ENTRY (I-SCREEN)
POC Amphetamine UR: POSITIVE — AB
POC Buprenorphine (BUP): NOT DETECTED
POC Cocaine UR: NOT DETECTED
POC Marijuana UR: POSITIVE — AB
POC Methadone UR: NOT DETECTED
POC Methamphetamine UR: POSITIVE — AB
POC Morphine: NOT DETECTED
POC Oxazepam (BZO): NOT DETECTED
POC Oxycodone UR: NOT DETECTED
POC Secobarbital (BAR): NOT DETECTED

## 2022-07-23 LAB — CBC WITH DIFFERENTIAL/PLATELET
Abs Immature Granulocytes: 0.04 10*3/uL (ref 0.00–0.07)
Basophils Absolute: 0 10*3/uL (ref 0.0–0.1)
Basophils Relative: 0 %
Eosinophils Absolute: 0.1 10*3/uL (ref 0.0–0.5)
Eosinophils Relative: 1 %
HCT: 45.8 % (ref 39.0–52.0)
Hemoglobin: 15.6 g/dL (ref 13.0–17.0)
Immature Granulocytes: 0 %
Lymphocytes Relative: 21 %
Lymphs Abs: 2.3 10*3/uL (ref 0.7–4.0)
MCH: 29.2 pg (ref 26.0–34.0)
MCHC: 34.1 g/dL (ref 30.0–36.0)
MCV: 85.6 fL (ref 80.0–100.0)
Monocytes Absolute: 0.9 10*3/uL (ref 0.1–1.0)
Monocytes Relative: 8 %
Neutro Abs: 7.9 10*3/uL — ABNORMAL HIGH (ref 1.7–7.7)
Neutrophils Relative %: 70 %
Platelets: 372 10*3/uL (ref 150–400)
RBC: 5.35 MIL/uL (ref 4.22–5.81)
RDW: 14 % (ref 11.5–15.5)
WBC: 11.3 10*3/uL — ABNORMAL HIGH (ref 4.0–10.5)
nRBC: 0 % (ref 0.0–0.2)

## 2022-07-23 LAB — COMPREHENSIVE METABOLIC PANEL
ALT: 21 U/L (ref 0–44)
AST: 32 U/L (ref 15–41)
Albumin: 4.6 g/dL (ref 3.5–5.0)
Alkaline Phosphatase: 71 U/L (ref 38–126)
Anion gap: 11 (ref 5–15)
BUN: 8 mg/dL (ref 8–23)
CO2: 25 mmol/L (ref 22–32)
Calcium: 9.8 mg/dL (ref 8.9–10.3)
Chloride: 100 mmol/L (ref 98–111)
Creatinine, Ser: 0.99 mg/dL (ref 0.61–1.24)
GFR, Estimated: >60 mL/min (ref >60)
Glucose, Bld: 83 mg/dL (ref 70–99)
Potassium: 4.1 mmol/L (ref 3.5–5.1)
Sodium: 136 mmol/L (ref 135–145)
Total Bilirubin: 1 mg/dL (ref 0.3–1.2)
Total Protein: 7.7 g/dL (ref 6.5–8.1)

## 2022-07-23 LAB — TSH: TSH: 1.103 u[IU]/mL (ref 0.350–4.500)

## 2022-07-23 MED ORDER — ALUM & MAG HYDROXIDE-SIMETH 200-200-20 MG/5ML PO SUSP: 30.0000 mL | ORAL | Status: DC | PRN

## 2022-07-23 MED ORDER — IPRATROPIUM BROMIDE HFA 17 MCG/ACT IN AERS
2.0000 | INHALATION_SPRAY | RESPIRATORY_TRACT | Status: DC | PRN
  Administered 2022-07-23 – 2022-07-24 (×3): 2 via RESPIRATORY_TRACT
  Filled 2022-07-23: qty 12.9

## 2022-07-23 MED ORDER — MAGNESIUM HYDROXIDE 400 MG/5ML PO SUSP: 30.0000 mL | Freq: Every day | ORAL | Status: DC | PRN

## 2022-07-23 MED ORDER — HYDROXYZINE HCL 25 MG PO TABS
50.0000 mg | ORAL_TABLET | Freq: Three times a day (TID) | ORAL | Status: DC | PRN
  Administered 2022-07-23: 50 mg via ORAL
  Filled 2022-07-23: qty 2

## 2022-07-23 MED ORDER — ZIPRASIDONE MESYLATE 20 MG IM SOLR: 20.0000 mg | Freq: Two times a day (BID) | INTRAMUSCULAR | Status: DC | PRN

## 2022-07-23 MED ORDER — ACETAMINOPHEN 325 MG PO TABS: 650.0000 mg | ORAL_TABLET | Freq: Four times a day (QID) | ORAL | Status: DC | PRN

## 2022-07-23 MED ORDER — TRAZODONE HCL 50 MG PO TABS
50.0000 mg | ORAL_TABLET | Freq: Every evening | ORAL | Status: DC | PRN
  Administered 2022-07-23: 50 mg via ORAL
  Filled 2022-07-23: qty 1

## 2022-07-23 MED ORDER — LORAZEPAM 1 MG PO TABS
1.0000 mg | ORAL_TABLET | ORAL | Status: AC | PRN
  Administered 2022-07-23: 1 mg via ORAL
  Filled 2022-07-23: qty 1
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
## 2022-07-23 NOTE — Progress Notes (Signed)
Pt was accepted to [facility] TODAY 07/23/2022, pending CBC, CMET, EKG, and UDS. Bed assignment: 320  Pt meets inpatient criteria per Thurston Hole, NP  Attending Physician will be Elane Fritz, DO  Report can be called to: [phone]  Pt can arrive after 8 PM  Care Team Notified: [facility] Malva Limes, RN, Thurston Hole, NP, and Hillery Jacks, NP  Cathie Beams, LCSW  07/23/2022 2:50 PM

## 2022-07-23 NOTE — Progress Notes (Signed)
   07/23/22 1318  [facility] Triage Screening (Walk-ins at [facility] only)  How Did You Hear About [place]? Other (Comment)  What Is the Reason for Your Visit/Call Today? Pt here with a friend reported SI with plan to shot himself. Pt reports last few days he has been using meth. Pt denies HI, AVH.  How Long Has This Been Causing You Problems? > than 6 months  Have You Recently Had Any Thoughts About Hurting Yourself? Yes  How long ago did you have thoughts about hurting yourself? for the past week.  Are You Planning to Commit Suicide/Harm Yourself At This time? Yes (last night plan to shoot self)  Have you Recently Had Thoughts About Hurting Someone Karolee Ohs? No  Are You Planning To Harm Someone At This Time? No  Are you currently experiencing any auditory, visual or other hallucinations? No  Have You Used Any Alcohol or Drugs in the Past 24 Hours? Yes  How long ago did you use Drugs or Alcohol? last night  What Did You Use and How Much? Used THC-$20, meth two $20 ice, beer 1-2 beers  Do you have any current medical co-morbidities that require immediate attention? No  Clinician description of patient physical appearance/behavior: anxious  What Do You Feel Would Help You the Most Today? Alcohol or Drug Use Treatment;Treatment for Depression or other mood problem  If access to [facility] was not available, would you have sought care in the Emergency Department? No  Determination of Need Urgent (48 hours)  Options For Referral Medication Management;Outpatient Therapy;Facility-Based Crisis;Inpatient Hospitalization

## 2022-07-23 NOTE — Discharge Instructions (Signed)
Patient accepted to BMU after 2000 tonight. Attending Dr. Marlou Porch.
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
## 2022-07-23 NOTE — ED Notes (Signed)
Pt sitting in chair watching television pt calm and cooperative pt contracts for safety alert and orient x 4 will continue to monitor for safety

## 2022-07-23 NOTE — ED Notes (Signed)
Pt laying in his bed talking with another pt , he is calm and cooperative he does have c/o the lights not dimming and not being able to sleep medications were given to help with him feeling anxious and sleep(see Mar). Pt denies SI@this  time Will continue to monitor for safety

## 2022-07-24 ENCOUNTER — Inpatient Hospital Stay
Admission: AD | Admit: 2022-07-24 | Discharge: 2022-07-30 | Disposition: A | Discharge status: Home / Self Care | Payer: Medicaid Other | Source: Intra-hospital | Attending: Psychiatry | Admitting: Psychiatry

## 2022-07-24 ENCOUNTER — Encounter: Payer: Self-pay | Admitting: Behavioral Health

## 2022-07-24 DIAGNOSIS — Z79899 Other long term (current) drug therapy: Secondary | ICD-10-CM

## 2022-07-24 DIAGNOSIS — R45851 Suicidal ideations: Secondary | ICD-10-CM | POA: Diagnosis present

## 2022-07-24 DIAGNOSIS — Z653 Problems related to other legal circumstances: Secondary | ICD-10-CM | POA: Diagnosis not present

## 2022-07-24 DIAGNOSIS — F15150 Other stimulant abuse with stimulant-induced psychotic disorder with delusions: Secondary | ICD-10-CM | POA: Diagnosis present

## 2022-07-24 DIAGNOSIS — Z8673 Personal history of transient ischemic attack (TIA), and cerebral infarction without residual deficits: Secondary | ICD-10-CM

## 2022-07-24 DIAGNOSIS — F19950 Other psychoactive substance use, unspecified with psychoactive substance-induced psychotic disorder with delusions: Principal | ICD-10-CM | POA: Insufficient documentation

## 2022-07-24 MED ORDER — HALOPERIDOL 5 MG PO TABS: 5.0000 mg | ORAL_TABLET | Freq: Three times a day (TID) | ORAL | Status: DC | PRN

## 2022-07-24 MED ORDER — HALOPERIDOL LACTATE 5 MG/ML IJ SOLN: 5.0000 mg | Freq: Three times a day (TID) | INTRAMUSCULAR | Status: DC | PRN

## 2022-07-24 MED ORDER — MENTHOL 3 MG MT LOZG: 1.0000 | LOZENGE | Freq: Every day | OROMUCOSAL | Status: DC | PRN

## 2022-07-24 MED ORDER — ALUM & MAG HYDROXIDE-SIMETH 200-200-20 MG/5ML PO SUSP: 30.0000 mL | ORAL | Status: DC | PRN

## 2022-07-24 MED ORDER — LORAZEPAM 2 MG PO TABS: 2.0000 mg | ORAL_TABLET | Freq: Three times a day (TID) | ORAL | Status: DC | PRN

## 2022-07-24 MED ORDER — DIPHENHYDRAMINE HCL 25 MG PO CAPS: 50.0000 mg | ORAL_CAPSULE | Freq: Three times a day (TID) | ORAL | Status: DC | PRN

## 2022-07-24 MED ORDER — MAGNESIUM HYDROXIDE 400 MG/5ML PO SUSP: 30.0000 mL | Freq: Every day | ORAL | Status: DC | PRN

## 2022-07-24 MED ORDER — ACETAMINOPHEN 325 MG PO TABS: 650.0000 mg | ORAL_TABLET | Freq: Four times a day (QID) | ORAL | Status: DC | PRN

## 2022-07-24 MED ORDER — DIPHENHYDRAMINE HCL 50 MG/ML IJ SOLN: 50.0000 mg | Freq: Three times a day (TID) | INTRAMUSCULAR | Status: DC | PRN

## 2022-07-24 MED ORDER — LORAZEPAM 2 MG/ML IJ SOLN: 2.0000 mg | Freq: Three times a day (TID) | INTRAMUSCULAR | Status: DC | PRN

## 2022-07-24 MED ORDER — TRAZODONE HCL 50 MG PO TABS
50.0000 mg | ORAL_TABLET | Freq: Every evening | ORAL | Status: DC | PRN
  Administered 2022-07-24 – 2022-07-30 (×6): 50 mg via ORAL
  Filled 2022-07-24 (×7): qty 1

## 2022-07-24 NOTE — Progress Notes (Signed)
Per [facility] AC Fransico Michael, RN pt will NOT transfer to [facility] during this night shift due [facility] nursing reporting that pt is agitated and refusing to transfer at this time. 1st shift CSW/Disposition to follow up.   Maryjean Ka, MSW, LCSWA 07/24/2022 1:20 AM

## 2022-07-24 NOTE — ED Notes (Signed)
Patient alert and oriented x 3. Denies SI/HI/AVH. Denies intent or plan to harm self or others. Routine conducted according to faculty protocol. Encourage patient to notify staff with any needs or concerns. Patient verbalized agreement and understanding. Will continue to monitor for safety. 

## 2022-07-24 NOTE — Tx Team (Signed)
Initial Treatment Plan 07/24/2022 3:52 PM [name] WUJ:[id number]    PATIENT STRESSORS: Financial difficulties   Marital or family conflict   Medication change or noncompliance   Occupational concerns   Substance abuse   Traumatic event     PATIENT STRENGTHS: Capable of independent living  Communication skills  General fund of knowledge  Motivation for treatment/growth    PATIENT IDENTIFIED PROBLEMS: Substance use/abuse  Financial issues  Roommate conflict                 DISCHARGE CRITERIA:  Ability to meet basic life and health needs Adequate post-discharge living arrangements Improved stabilization in mood, thinking, and/or behavior Reduction of life-threatening or endangering symptoms to within safe limits Verbal commitment to aftercare and medication compliance  PRELIMINARY DISCHARGE PLAN: Outpatient therapy Return to previous living arrangement Return to previous work or school arrangements  PATIENT/FAMILY INVOLVEMENT: This treatment plan has been presented to and reviewed with the patient, Dalton Harris, and/or family member. The patient and family have been given the opportunity to ask questions and make suggestions.  Delos Haring, RN 07/24/2022, 3:52 PM

## 2022-07-24 NOTE — Plan of Care (Signed)
  Problem: Education: Goal: Knowledge of General Education information will improve Description: Including pain rating scale, medication(s)/side effects and non-pharmacologic comfort measures Outcome: Not Progressing   Problem: Health Behavior/Discharge Planning: Goal: Ability to manage health-related needs will improve Outcome: Not Progressing   Problem: Clinical Measurements: Goal: Ability to maintain clinical measurements within normal limits will improve Outcome: Not Progressing Goal: Will remain free from infection Outcome: Not Progressing Goal: Diagnostic test results will improve Outcome: Not Progressing Goal: Respiratory complications will improve Outcome: Not Progressing Goal: Cardiovascular complication will be avoided Outcome: Not Progressing   Problem: Activity: Goal: Risk for activity intolerance will decrease Outcome: Not Progressing   Problem: Nutrition: Goal: Adequate nutrition will be maintained Outcome: Not Progressing   Problem: Coping: Goal: Level of anxiety will decrease Outcome: Not Progressing   Problem: Elimination: Goal: Will not experience complications related to bowel motility Outcome: Not Progressing Goal: Will not experience complications related to urinary retention Outcome: Not Progressing   Problem: Pain Managment: Goal: General experience of comfort will improve Outcome: Not Progressing   Problem: Safety: Goal: Ability to remain free from injury will improve Outcome: Not Progressing   Problem: Skin Integrity: Goal: Risk for impaired skin integrity will decrease Outcome: Not Progressing   Problem: Education: Goal: Ability to make informed decisions regarding treatment will improve Outcome: Not Progressing   Problem: Coping: Goal: Coping ability will improve Outcome: Not Progressing   Problem: Health Behavior/Discharge Planning: Goal: Identification of resources available to assist in meeting health care needs will  improve Outcome: Not Progressing   Problem: Medication: Goal: Compliance with prescribed medication regimen will improve Outcome: Not Progressing   Problem: Self-Concept: Goal: Ability to disclose and discuss suicidal ideas will improve Outcome: Not Progressing Goal: Will verbalize positive feelings about self Outcome: Not Progressing   Problem: Education: Goal: Knowledge of [facility] Education information/materials will improve Outcome: Not Progressing Goal: Emotional status will improve Outcome: Not Progressing Goal: Mental status will improve Outcome: Not Progressing Goal: Verbalization of understanding the information provided will improve Outcome: Not Progressing   Problem: Activity: Goal: Interest or engagement in activities will improve Outcome: Not Progressing Goal: Sleeping patterns will improve Outcome: Not Progressing   Problem: Coping: Goal: Ability to verbalize frustrations and anger appropriately will improve Outcome: Not Progressing Goal: Ability to demonstrate self-control will improve Outcome: Not Progressing   Problem: Health Behavior/Discharge Planning: Goal: Identification of resources available to assist in meeting health care needs will improve Outcome: Not Progressing Goal: Compliance with treatment plan for underlying cause of condition will improve Outcome: Not Progressing   Problem: Physical Regulation: Goal: Ability to maintain clinical measurements within normal limits will improve Outcome: Not Progressing   Problem: Safety: Goal: Periods of time without injury will increase Outcome: Not Progressing   

## 2022-07-24 NOTE — ED Notes (Signed)
Rn called sheriff to pick patient up to take to [facility] . States that it may be a whilke before pick-up.

## 2022-07-24 NOTE — Group Note (Signed)
Date:  07/24/2022 Time:  9:24 PM  Group Topic/Focus:  Wrap-Up Group:   The focus of this group is to help patients review their daily goal of treatment and discuss progress on daily workbooks.    Participation Level:  Minimal  Participation Quality:  Appropriate  Affect:  Appropriate  Cognitive:  Alert and Appropriate  Insight: Appropriate  Engagement in Group:  None  Modes of Intervention:  Clarification  Additional Comments:     Maglione,Dalton Harris 07/24/2022, 9:24 PM

## 2022-07-24 NOTE — Progress Notes (Signed)
Patient calm and pleasant during assessment denying SI/HI/AVH. Pt observed interacting appropriately with staff and peers on the unit. Pt compliant with medication administration per MD orders. Pt given education, support, and encouragement to be active in his treatment plan. Pt being monitored Q 15 minutes for safety per unit protocol, remains safe on the unit  

## 2022-07-24 NOTE — Progress Notes (Signed)
Admission Note:   Report was received from [place], [place] on a 62 year-old male who presents IVC in no acute distress for the treatment of SI and Depression. Patient appears angry and irritable. Patient was cooperative with admission process. Patient endorsed both depression and anxiety stating "sometimes, but not enough to hinder me". Patient denies SI/HI, AVH and pain at this time. Patient has some anger and frustration towards his room mate, stating "I don't put my hands on a woman, but if something doesn't change, it's going to get physical". Patient has a past medical history of Stroke. Skin was assessed with [facility], RN and found to be clear of any abnormal marks except for scratch marks to his left arm and above right eye. Patient searched and no contraband found and unit policies explained and understanding verbalized. Consents obtained. Food and fluids offered, and fluids accepted. Patient had no additional questions or concerns to voice at this time.

## 2022-07-24 NOTE — ED Notes (Signed)
Safe transport called to take pt to [facility]

## 2022-07-24 NOTE — ED Provider Notes (Signed)
FBC/OBS ASAP Discharge Summary  Date and Time: 07/24/2022 9:08 AM  Name: Dalton Harris  MRN:  [id number]   Discharge Diagnoses:  Final diagnoses:  Suicidal ideation  07/23/2022 Admission HPI: Dalton Harris is a 62 year old male presenting to [facility] voluntarily with chief complaint of suicidal ideations with plan to shoot himself and polysubstance abuse. Thursday patient planned to meet up with some friends and say goodbye before shooting himself with a shotgun. Patient reports someone he knew from his past seen him and suggested he use drugs so for the past 3-4 days patient has been up using meth, THC, alcohol and cocaine. Patient reports he felt hopeless, isolated and having racing thoughts of past traumas which triggered his thoughts to kill himself. Patient is here with one of his good friends who reports that patient is living with a mutual male friend, and she told him that patient was not doing well and they were having conflict with one another. Patient friend reports that it was about to get violent between them two. Patient also wrote a note to the roommate that he was having thoughts about killing himself.   Dalton Harris, 62 y.o., male patient seen face to face by this provider, consulted with Dr, Lucianne Muss and chart reviewed on 07/24/22.  Subjective:  During evaluation Dalton Harris is observed laying in his bed awake. He is irritable upon approach. He is alert, oriented x 4, cooperative and attentive. He is disheveled.  He continues to endorse depression with a  labile affect. His speech is pressured and loud at times.  He complains due to not being able to sleep on the unit.  States the lights are never turned off and the doors are slammed all night.  He reports that his suicidal ideation as intense as they were on admission.  However he continues to endorse SI and cannot contract for safety. His suicide note on admission is attached below.  He is denying homicidal ideations.  He denies  auditory/visual hallucinations. Objectively there is no evidence of psychosis/mania or delusional thinking.  Patient is able to converse coherently, goal directed thoughts, no distractibility, or pre-occupation.    Stay Summary:   Patient continues to meet criteria for inpatient psychiatric admission.  He was accepted at [facility] but later in the evening refused to go.  States he was concerned that he would be arrested because going out of the county would violate his probation.  This Clinical research associate contacted parole board and spoke with Ms. Mayford Knife [phone], patients parole office is [place].  They state patient has no concerns he can be transferred to another county due to medical and psychiatric reasons.  Patient does meet criteria for involuntary commitment.  He is petitioned at this time.    Media Information    Total Time spent with patient: 45 minutes  Past Psychiatric History: see H&P Past Medical History: see H&P Family History: see H&P Family Psychiatric History: see H&P Social History: see H&P Tobacco Cessation:  Prescription not provided because: he is being transferred to [facility] for IP admission  Current Medications:  Current Facility-Administered Medications  Medication Dose Route Frequency Provider Last Rate Last Admin   acetaminophen (TYLENOL) tablet 650 mg  650 mg Oral Q6H PRN Bennett, Christal H, NP       alum & mag hydroxide-simeth (MAALOX/MYLANTA) 200-200-20 MG/5ML suspension 30 mL  30 mL Oral Q4H PRN Bennett, Christal H, NP       hydrOXYzine (ATARAX) tablet 50 mg  50 mg  Oral TID PRN Thurston Hole H, NP   50 mg at 07/23/22 2132   ipratropium (ATROVENT HFA) inhaler 2 puff  2 puff Inhalation Q4H PRN Bobbitt, Shalon E, NP   2 puff at 07/24/22 0846   magnesium hydroxide (MILK OF MAGNESIA) suspension 30 mL  30 mL Oral Daily PRN Bennett, Christal H, NP       menthol-cetylpyridinium (CEPACOL) lozenge 3 mg  1 lozenge Oral Daily PRN Onuoha, Chinwendu V,  NP       traZODone (DESYREL) tablet 50 mg  50 mg Oral QHS PRN Bennett, Christal H, NP   50 mg at 07/23/22 2132   ziprasidone (GEODON) injection 20 mg  20 mg Intramuscular Q12H PRN Bobbitt, Shalon E, NP       No current outpatient medications on file.    PTA Medications:  Facility Ordered Medications  Medication   acetaminophen (TYLENOL) tablet 650 mg   alum & mag hydroxide-simeth (MAALOX/MYLANTA) 200-200-20 MG/5ML suspension 30 mL   magnesium hydroxide (MILK OF MAGNESIA) suspension 30 mL   hydrOXYzine (ATARAX) tablet 50 mg   traZODone (DESYREL) tablet 50 mg   ipratropium (ATROVENT HFA) inhaler 2 puff   ziprasidone (GEODON) injection 20 mg   And   [COMPLETED] LORazepam (ATIVAN) tablet 1 mg   menthol-cetylpyridinium (CEPACOL) lozenge 3 mg        No data to display          Flowsheet Row ED from 07/23/2022 in [facility] ED from 06/04/2022 in [facility] Emergency Department at [facility] ED from 02/17/2021 in [facility] Emergency Department at [facility]  C-SSRS RISK CATEGORY Low Risk No Risk No Risk       Musculoskeletal  Strength & Muscle Tone: within normal limits Gait & Station: normal Patient leans: N/A  Psychiatric Specialty Exam  Presentation  General Appearance:  Casual; Disheveled  Eye Contact: Good  Speech: Clear and Coherent; Normal Rate; Pressured (pressured at times)  Speech Volume: -- (increased at times)  Handedness: Right   Mood and Affect  Mood: Depressed; Irritable; Labile  Affect: Congruent   Thought Process  Thought Processes: Coherent  Descriptions of Associations:Intact  Orientation:Full (Time, Place and Person)  Thought Content:Logical  Diagnosis of Schizophrenia or Schizoaffective disorder in past: No    Hallucinations:Hallucinations: None  Ideas of Reference:None  Suicidal Thoughts:Suicidal Thoughts: Yes, Passive SI Passive Intent and/or Plan: With Intent; With Plan;  With Means to Carry Out  Homicidal Thoughts:Homicidal Thoughts: No   Sensorium  Memory: Immediate Good; Recent Good; Remote Good  Judgment: Fair  Insight: Fair   Art therapist  Concentration: Good  Attention Span: Good  Recall: Good  Fund of Knowledge: Good  Language: Good   Psychomotor Activity  Psychomotor Activity: Psychomotor Activity: Normal   Assets  Assets: Communication Skills; Desire for Improvement; Physical Health; Resilience; Leisure Time; Housing; Financial Resources/Insurance   Sleep  Sleep: Sleep: Poor Number of Hours of Sleep: 4   Nutritional Assessment (For OBS and FBC admissions only) Has the patient had a weight loss or gain of 10 pounds or more in the last 3 months?: No Has the patient had a decrease in food intake/or appetite?: Yes Does the patient have dental problems?: No Does the patient have eating habits or behaviors that may be indicators of an eating disorder including binging or inducing vomiting?: No Has the patient recently lost weight without trying?: 0 Has the patient been eating poorly because of a decreased appetite?: 0 Malnutrition  Screening Tool Score: 0    Physical Exam  Physical Exam Vitals and nursing note reviewed.  Constitutional:      General: He is not in acute distress.    Appearance: He is well-developed.  HENT:     Head: Normocephalic and atraumatic.  Eyes:     General:        Right eye: No discharge.        Left eye: No discharge.  Cardiovascular:     Rate and Rhythm: Normal rate.  Pulmonary:     Effort: Pulmonary effort is normal. No respiratory distress.  Musculoskeletal:        General: Normal range of motion.     Cervical back: Normal range of motion.  Skin:    Coloration: Skin is not jaundiced or pale.  Neurological:     Mental Status: He is alert and oriented to person, place, and time.  Psychiatric:        Attention and Perception: Attention and perception normal.         Mood and Affect: Mood is anxious and depressed. Affect is labile and angry.        Speech: Speech normal.        Behavior: Behavior is agitated. Behavior is cooperative.        Thought Content: Thought content includes suicidal ideation. Thought content includes suicidal plan.        Cognition and Memory: Cognition normal.        Judgment: Judgment is impulsive.    Review of Systems  Constitutional: Negative.   HENT: Negative.    Eyes: Negative.   Respiratory: Negative.    Cardiovascular: Negative.   Musculoskeletal: Negative.   Skin: Negative.   Neurological: Negative.   Psychiatric/Behavioral:  Positive for depression, substance abuse and suicidal ideas. The patient is nervous/anxious.    Blood pressure 127/75, pulse 72, temperature 97.7 F (36.5 C), temperature source Oral, resp. rate 18, SpO2 100 %. There is no height or weight on file to calculate BMI.   Disposition: Discharge patient and transfer to [facility] for IP admission. Pt placed under IVC at this time.   Ardis Hughs, NP 07/24/2022, 9:08 AM

## 2022-07-24 NOTE — ED Notes (Signed)
Patient  sleeping in no acute stress. RR even and unlabored .Environment secured .Will continue to monitor for safely. 

## 2022-07-24 NOTE — ED Notes (Signed)
Report given to [facility] RN@[facility] 

## 2022-07-24 NOTE — ED Notes (Signed)
Pt sleeping@this time. Breathing even and unlabored. Will continue to monitor for safety 

## 2022-07-24 NOTE — Progress Notes (Signed)
When patient was informed that his bed placement was at [facility], he began to yell and scream, "I cannot leave the city of [place] or I WILL GO BACK TO PRISON! MY MAMA IS BURIED IN [place] AND MY PAROLE OFFICER WILL NOT EVEN LET ME GO SEE HER GRAVE.  I AM NOT GOING BACK TO PRISON."  Patient does not have ability to reason or apply logic at this time.  Patient states, "I AM NOT TAKING ANY MEDICINE NO MATTER WHAT ANYONE SAYS.  I AM TRYING TO STAY OFF OF DRUGS!"  He also made several verbal threats towards staff and peers.  Unable to relocate patient to a different area due to unit at capacity.  Patient continues to yell.

## 2022-07-24 NOTE — ED Notes (Signed)
Rn called report to demetrica rn at [facility] .

## 2022-07-24 NOTE — ED Notes (Signed)
Pt agitated, refusing transfer to [facility]. Because of parole restrictions her reports.  NP [place] notifed and states pt to remain in [facility] and bed search resume in am.  Pt laying on stretcher at present.

## 2022-07-24 NOTE — ED Notes (Signed)
Rn could not  print the emtala form . Notified Rn at on coming facility by message. Notified my Ac. The ematala is in the computer.

## 2022-07-24 NOTE — ED Notes (Signed)
Patient A&O x 4, ambulatory. Patient discharged in no acute distress. Patient denied SI/HI, A/VH upon discharge.  Pt belongings returned to patient from locker #intact. Patient escorted to lobby via staff for transport to destination. Safety maintained. Patient was taken to alamnce BMU by sheriff due to being IVC

## 2022-07-24 NOTE — ED Notes (Signed)
[employer] called to serve ivc

## 2022-07-25 DIAGNOSIS — F19950 Other psychoactive substance use, unspecified with psychoactive substance-induced psychotic disorder with delusions: Secondary | ICD-10-CM | POA: Diagnosis not present

## 2022-07-25 DIAGNOSIS — R45851 Suicidal ideations: Secondary | ICD-10-CM | POA: Diagnosis not present

## 2022-07-25 MED ORDER — SERTRALINE HCL 25 MG PO TABS
50.0000 mg | ORAL_TABLET | Freq: Every day | ORAL | Status: DC
  Administered 2022-07-25 – 2022-07-26 (×2): 50 mg via ORAL
  Filled 2022-07-25 (×4): qty 2

## 2022-07-25 NOTE — Progress Notes (Signed)
Patient calm and pleasant during assessment denying SI/HI/AVH. Pt observed interacting appropriately with staff and peers on the unit. Pt compliant with medication administration per MD orders. Pt given education, support, and encouragement to be active in his treatment plan. Pt being monitored Q 15 minutes for safety per unit protocol, remains safe on the unit  

## 2022-07-25 NOTE — H&P (Signed)
Psychiatric Admission Assessment Adult  Patient Identification: Dalton Harris MRN:  [id number] Date of Evaluation:  07/25/2022 Chief Complaint:  Suicidal ideation [R45.851] Principal Diagnosis: Suicidal ideation Diagnosis:  Principal Problem:   Suicidal ideation Depression with suicidal ideations Methamphetamine abuse Substance use disorder Personality disorder not otherwise specified Rule out bipolar disorder type II  Identifying information and reason for admission: The patient is a 62 year old male who was admitted to [facility] behavioral health on an IVC with suicidal ideations and depression.  He admits to using methamphetamine prior to admission.  History of Present Illness: Most of the information was obtained from the records and patient interview.  According to the records the patient initially presented himself voluntarily with complaints of suicidal ideations with a plan to shoot himself and substance use disorder.  Apparently he met some friends and claims that he had a shotgun and he was going to use it.  He claims that he met a friend and both of them started using methamphetamine.  Apparently has been abusing alcohol and methamphetamine for about 3 to 4 days.  He also uses THC and cocaine.  He started feeling helpless and hopeless and had some racing thoughts about past trauma that triggered thoughts to kill himself.  He also wrote a suicide note stating that he wanted to die and thought it would be a good thing for him to go out and kill himself.  Apparently he was sitting [place] the railroad tracks with a loaded gun.  He was felt to meet the criteria for inpatient admission.  Patient is on probation and his parole officer was contacted.  On examination today:  The patient was noted to be an alert oriented and fairly cooperative 62 year old male who reports that his father was from [place] and that he grew up in the [place] area.  He apparently lives with a girl off and on.  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  However he claims that he is just a scam because he has no idea how to read the tarot cards.  He continues to endorse irritability and frustration and passive suicidal ideations.  Apparently has been incarcerated several times.  He does not specify why he was incarcerated.  He did get his GED in prison.  He recently got out of prison on 24 January.  He is currently on probation.  During the interview he was somewhat irritable claiming that he was not going to kill himself but he wants to go home and take care of his dogs because the woman he lives with him does not care for his parents.  He is currently contracting for safety. Associated Signs/Symptoms: Depression Symptoms:  depressed mood, psychomotor agitation, feelings of worthlessness/guilt, difficulty concentrating, suicidal thoughts with specific plan, (Hypo) Manic Symptoms:  Impulsivity, Irritable Mood, Labiality of Mood, Anxiety Symptoms:  Excessive Worry, Psychotic Symptoms:  Paranoia, PTSD Symptoms: Negative Total Time spent with patient: 30 minutes  Past Psychiatric History: Apparently there is a history of suicidal attempts in the past, history of narcotic overdose.  Patient has been incarcerated but is unclear if he was treated with any active medications at this time.  He does not remember prior psychotropics.  Is the patient at risk to self? Yes.    Has the patient been a risk to self in the past 6 months? Yes.    Has the patient been a risk to self within the distant past? Yes.  Is the patient a risk to others? No.  Has the patient been a risk to others in the past 6 months? No.  Has the patient been a risk to others within the distant past? No.   [place] Scale:  Flowsheet Row Admission (Current) from 07/24/2022 in [facility]IENT BEH[facility]ICINE ED from 07/23/2022 in [facility] ED from 06/04/2022 in [facility] Emergency Department at [facility]  C-SSRS RISK CATEGORY No Risk Low Risk No Risk        Prior Inpatient Therapy: Yes.   If yes, describe patient appears to have had mental health contact for opiate overdose in 2022. Prior Outpatient Therapy: No. If yes, describe past history is unknown.  Alcohol Screening: 1. How often do you have a drink containing alcohol?: 2 to 3 times a week 2. How many drinks containing alcohol do you have on a typical day when you are drinking?: 3 or 4 3. How often do you have six or more drinks on one occasion?: Never AUDIT-C Score: 4 4. How often during the last year have you found that you were not able to stop drinking once you had started?: Less than monthly 5. How often during the last year have you failed to do what was normally expected from you because of drinking?: Less than monthly 6. How often during the last year have you needed a first drink in the morning to get yourself going after a heavy drinking session?: Less than monthly 7. How often during the last year have you had a feeling of guilt of remorse after drinking?: Less than monthly 8. How often during the last year have you been unable to remember what happened the night before because you had been drinking?: Less than monthly 9. Have you or someone else been injured as a result of your drinking?: No 10. Has a relative or friend or a doctor or another health worker been concerned about your drinking or suggested you cut down?: No Alcohol Use Disorder Identification Test Final Score (AUDIT): 9 Alcohol Brief Interventions/Follow-up: Alcohol education/Brief advice Substance Abuse History in the last 12 months:  Yes.   Consequences of Substance Abuse: Medical Consequences:  History of CVA in the past. Suicidal ideations. Previous Psychotropic Medications:  Unknown . Psychological Evaluations:  Unknown. Past Medical History:  Past Medical History:  Diagnosis Date   Ischemic stroke  [facility])    History reviewed. No pertinent surgical history. Family History: History reviewed. No pertinent family history. Family Psychiatric  History: Unknown. Tobacco Screening:  Social History   Tobacco Use  Smoking Status Never  Smokeless Tobacco Never    BH Tobacco Counseling     Are you interested in Tobacco Cessation Medications?  No value filed. Counseled patient on smoking cessation:  No value filed. Reason Tobacco Screening Not Completed: No value filed.       Social History:  Social History   Substance and Sexual Activity  Alcohol Use Not Currently     Social History   Substance and Sexual Activity  Drug Use Yes   Types: Cocaine    Additional Social History:                           Allergies:   Allergies  Allergen Reactions   Broccoli [Brassica Oleracea] Anaphylaxis   Novocain [Procaine] Anaphylaxis   Morphine And Codeine Other (See Comments)    Pt does not want Narcotics d/t hx of  addiction.    Lab Results:  Results for orders placed or performed during the hospital encounter of 07/23/22 (from the past 48 hour(s))  POCT Urine Drug Screen - (I-Screen)     Status: Abnormal   Collection Time: 07/23/22 11:54 PM  Result Value Ref Range   POC Amphetamine UR Positive (A) NONE DETECTED (Cut Off Level 1000 ng/mL)   POC Secobarbital (BAR) None Detected NONE DETECTED (Cut Off Level 300 ng/mL)   POC Buprenorphine (BUP) None Detected NONE DETECTED (Cut Off Level 10 ng/mL)   POC Oxazepam (BZO) None Detected NONE DETECTED (Cut Off Level 300 ng/mL)   POC Cocaine UR None Detected NONE DETECTED (Cut Off Level 300 ng/mL)   POC Methamphetamine UR Positive (A) NONE DETECTED (Cut Off Level 1000 ng/mL)   POC Morphine None Detected NONE DETECTED (Cut Off Level 300 ng/mL)   POC Methadone UR None Detected NONE DETECTED (Cut Off Level 300 ng/mL)   POC Oxycodone UR None Detected NONE DETECTED (Cut Off Level 100 ng/mL)   POC Marijuana UR Positive (A) NONE DETECTED  (Cut Off Level 50 ng/mL)    Blood Alcohol level:  Lab Results  Component Value Date   ETH <10 02/17/2021   ETH <10 09/08/2020    Metabolic Disorder Labs:  No results found for: "HGBA1C", "MPG" No results found for: "PROLACTIN" No results found for: "CHOL", "TRIG", "HDL", "CHOLHDL", "VLDL", "LDLCALC"  Current Medications: Current Facility-Administered Medications  Medication Dose Route Frequency Provider Last Rate Last Admin   acetaminophen (TYLENOL) tablet 650 mg  650 mg Oral Q6H PRN Bobbitt, Shalon E, NP       alum & mag hydroxide-simeth (MAALOX/MYLANTA) 200-200-20 MG/5ML suspension 30 mL  30 mL Oral Q4H PRN Bobbitt, Shalon E, NP       diphenhydrAMINE (BENADRYL) capsule 50 mg  50 mg Oral TID PRN Bobbitt, Shalon E, NP       Or   diphenhydrAMINE (BENADRYL) injection 50 mg  50 mg Intramuscular TID PRN Bobbitt, Shalon E, NP       haloperidol (HALDOL) tablet 5 mg  5 mg Oral TID PRN Bobbitt, Shalon E, NP       Or   haloperidol lactate (HALDOL) injection 5 mg  5 mg Intramuscular TID PRN Bobbitt, Shalon E, NP       LORazepam (ATIVAN) tablet 2 mg  2 mg Oral TID PRN Bobbitt, Shalon E, NP       Or   LORazepam (ATIVAN) injection 2 mg  2 mg Intramuscular TID PRN Bobbitt, Shalon E, NP       magnesium hydroxide (MILK OF MAGNESIA) suspension 30 mL  30 mL Oral Daily PRN Bobbitt, Shalon E, NP       traZODone (DESYREL) tablet 50 mg  50 mg Oral QHS PRN Bobbitt, Shalon E, NP   50 mg at 07/24/22 2047   PTA Medications: No medications prior to admission.    Musculoskeletal: Strength & Muscle Tone: within normal limits Gait & Station: normal Patient leans: N/A            Psychiatric Specialty Exam:  Presentation  General Appearance:  Bizarre; Casual  Eye Contact: Fair  Speech: Garbled  Speech Volume: Normal  Handedness: Right   Mood and Affect  Mood: Irritable  Affect: Labile   Thought Process  Thought Processes: Disorganized  Duration of Psychotic  Symptoms:N/A Past Diagnosis of Schizophrenia or Psychoactive disorder: Yes  Descriptions of Associations:Intact  Orientation:Full (Time, Place and Person)  Thought Content:Illogical  Hallucinations:Hallucinations: None  Ideas of Reference:None  Suicidal Thoughts:Suicidal Thoughts: Yes, Passive SI Passive Intent and/or Plan: Without Intent; Without Plan  Homicidal Thoughts:Homicidal Thoughts: No   Sensorium  Memory: Immediate Fair; Recent Poor; Remote Fair  Judgment: Poor  Insight: Poor   Executive Functions  Concentration: Fair  Attention Span: Fair  Recall: Fair  Fund of Knowledge: Fair  Language: Fair   Psychomotor Activity  Psychomotor Activity: Psychomotor Activity: Normal   Assets  Assets: Manufacturing systems engineer; Desire for Improvement   Sleep  Sleep: Sleep: Fair Number of Hours of Sleep: 4    Physical Exam: Physical Exam Constitutional:      Appearance: Normal appearance.  Neurological:     General: No focal deficit present.     Mental Status: He is alert and oriented to person, place, and time. Mental status is at baseline.    Review of Systems  Psychiatric/Behavioral:  Positive for depression, substance abuse and suicidal ideas.    Blood pressure 135/68, pulse (!) 57, temperature 98 F (36.7 C), temperature source Oral, resp. rate 18, height 5\' 11"  (1.803 m), weight 68.5 kg, SpO2 99 %. Body mass index is 21.06 kg/m.  Treatment Plan Summary: Daily contact with patient to assess and evaluate symptoms and progress in treatment and Medication management  Observation Level/Precautions:  15 minute checks  Laboratory:  CBC Chemistry Profile  Psychotherapy: Individual and group.  Medications: Consider SSRI.  Trial of Zoloft 50 mg a day.  Evaluate the need for any additional psychotropics.  Consultations:    Discharge Concerns: Homelessness, suicidal ideations.  Estimated LOS: 5 days.  Other:     Physician Treatment Plan for  Primary Diagnosis: Suicidal ideation Long Term Goal(s): Improvement in symptoms so as ready for discharge  Short Term Goals: Ability to identify changes in lifestyle to reduce recurrence of condition will improve, Ability to verbalize feelings will improve, and Ability to disclose and discuss suicidal ideas  Physician Treatment Plan for Secondary Diagnosis: Principal Problem:   Suicidal ideation  Long Term Goal(s): Improvement in symptoms so as ready for discharge  Short Term Goals: Ability to identify and develop effective coping behaviors will improve and Compliance with prescribed medications will improve  I certify that inpatient services furnished can reasonably be expected to improve the patient's condition.    Rex Kras, MD 6/26/20241:57 PM

## 2022-07-25 NOTE — Group Note (Signed)
Recreation Therapy Group Note   Group Topic:Problem Solving  Group Date: 07/25/2022 Start Time: 1000 End Time: 1050 Facilitators: Rosina Lowenstein, LRT, CTRS Location:  Craft Room  Group Description: Life Boat. Patients were given the scenario that they are on a boat that is about to become shipwrecked, leaving them stranded on an [place]. They are asked to make a list of 15 different items that they want to take with them when they are stranded on the [place]. Patients are asked to rank their items from most important to least important, #1 being the most important and #15 being the least. Patients will work individually for the first round to come up with 15 items and then pair up with a peer(s) to condense their list and come up with one list of 15 items between the two of them. Patients or LRT will read aloud the 15 different items to the group after each round. LRT facilitated post-activity processing to discuss how this activity can be used in daily life post discharge.   Goal Area(s) Addressed:  Patient will identify priorities, wants and needs. Patient will communicate with LRT and peers. Patient will work collectively as a Administrator, Civil Service. Patient will work on Product manager.   Affect/Mood: Appropriate   Participation Level: Moderate   Participation Quality: Independent   Behavior: Calm and Cooperative   Speech/Thought Process: Coherent   Insight: Fair   Judgement: Fair    Modes of Intervention: Activity   Patient Response to Interventions:  Receptive   Education Outcome:  In group clarification offered    Clinical Observations/Individualized Feedback: Dalton Harris was somewhat active in their participation of session activities and group discussion. Pt identified "I will never go on a cruise ship. You couldn't pay me to go. So, I don't need to have a list of anything." Pt did not make a list and shared with LRT that this activity was irrelevant for him. Pt was respectful  about it.  Plan: Continue to engage patient in RT group sessions 2-3x/week.   Rosina Lowenstein, LRT, CTRS 07/25/2022 11:43 AM

## 2022-07-25 NOTE — Progress Notes (Signed)
   07/25/22 1300  Psych Admission Type (Psych Patients Only)  Admission Status Involuntary  Psychosocial Assessment  Patient Complaints None  Eye Contact None  Facial Expression Flat  Affect Flat  Speech Logical/coherent  Interaction Assertive  Motor Activity Restless  Appearance/Hygiene In scrubs  Behavior Characteristics Cooperative  Mood Depressed  Thought Process  Coherency Disorganized  Content Preoccupation  Delusions None reported or observed  Perception WDL  Hallucination None reported or observed  Judgment Impaired  Confusion None  Danger to Self  Current suicidal ideation? Denies  Agreement Not to Harm Self Yes  Danger to Others  Danger to Others None reported or observed

## 2022-07-25 NOTE — BH IP Treatment Plan (Addendum)
Interdisciplinary Treatment and Diagnostic Plan Update  07/25/2022 Time of Session: 9:12 AM  Dalton Harris MRN: [id number]  Principal Diagnosis: Suicidal ideation  Secondary Diagnoses: Principal Problem:   Suicidal ideation   Current Medications:  Current Facility-Administered Medications  Medication Dose Route Frequency Provider Last Rate Last Admin   acetaminophen (TYLENOL) tablet 650 mg  650 mg Oral Q6H PRN Bobbitt, Shalon E, NP       alum & mag hydroxide-simeth (MAALOX/MYLANTA) 200-200-20 MG/5ML suspension 30 mL  30 mL Oral Q4H PRN Bobbitt, Shalon E, NP       diphenhydrAMINE (BENADRYL) capsule 50 mg  50 mg Oral TID PRN Bobbitt, Shalon E, NP       Or   diphenhydrAMINE (BENADRYL) injection 50 mg  50 mg Intramuscular TID PRN Bobbitt, Shalon E, NP       haloperidol (HALDOL) tablet 5 mg  5 mg Oral TID PRN Bobbitt, Shalon E, NP       Or   haloperidol lactate (HALDOL) injection 5 mg  5 mg Intramuscular TID PRN Bobbitt, Shalon E, NP       LORazepam (ATIVAN) tablet 2 mg  2 mg Oral TID PRN Bobbitt, Shalon E, NP       Or   LORazepam (ATIVAN) injection 2 mg  2 mg Intramuscular TID PRN Bobbitt, Shalon E, NP       magnesium hydroxide (MILK OF MAGNESIA) suspension 30 mL  30 mL Oral Daily PRN Bobbitt, Shalon E, NP       traZODone (DESYREL) tablet 50 mg  50 mg Oral QHS PRN Bobbitt, Shalon E, NP   50 mg at 07/24/22 2047   PTA Medications: No medications prior to admission.    Patient Stressors: Financial difficulties   Marital or family conflict   Medication change or noncompliance   Occupational concerns   Substance abuse   Traumatic event    Patient Strengths: Capable of independent living  Forensic psychologist fund of knowledge  Motivation for treatment/growth   Treatment Modalities: Medication Management, Group therapy, Case management,  1 to 1 session with clinician, Psychoeducation, Recreational therapy.   Physician Treatment Plan for Primary Diagnosis: Suicidal  ideation Long Term Goal(s):     Short Term Goals:    Medication Management: Evaluate patient's response, side effects, and tolerance of medication regimen.  Therapeutic Interventions: 1 to 1 sessions, Unit Group sessions and Medication administration.  Evaluation of Outcomes: Not Met  Physician Treatment Plan for Secondary Diagnosis: Principal Problem:   Suicidal ideation  Long Term Goal(s):     Short Term Goals:       Medication Management: Evaluate patient's response, side effects, and tolerance of medication regimen.  Therapeutic Interventions: 1 to 1 sessions, Unit Group sessions and Medication administration.  Evaluation of Outcomes: Not Met   RN Treatment Plan for Primary Diagnosis: Suicidal ideation Long Term Goal(s): Knowledge of disease and therapeutic regimen to maintain health will improve  Short Term Goals: Ability to remain free from injury will improve, Ability to verbalize frustration and anger appropriately will improve, Ability to demonstrate self-control, Ability to participate in decision making will improve, Ability to verbalize feelings will improve, Ability to disclose and discuss suicidal ideas, Ability to identify and develop effective coping behaviors will improve, and Compliance with prescribed medications will improve  Medication Management: RN will administer medications as ordered by provider, will assess and evaluate patient's response and provide education to patient for prescribed medication. RN will report any adverse and/or side effects to prescribing  provider.  Therapeutic Interventions: 1 on 1 counseling sessions, Psychoeducation, Medication administration, Evaluate responses to treatment, Monitor vital signs and CBGs as ordered, Perform/monitor CIWA, COWS, AIMS and Fall Risk screenings as ordered, Perform wound care treatments as ordered.  Evaluation of Outcomes: Not Met   LCSW Treatment Plan for Primary Diagnosis: Suicidal ideation Long Term  Goal(s): Safe transition to appropriate next level of care at discharge, Engage patient in therapeutic group addressing interpersonal concerns.  Short Term Goals: Engage patient in aftercare planning with referrals and resources, Increase social support, Increase ability to appropriately verbalize feelings, Increase emotional regulation, Facilitate acceptance of mental health diagnosis and concerns, Facilitate patient progression through stages of change regarding substance use diagnoses and concerns, Identify triggers associated with mental health/substance abuse issues, and Increase skills for wellness and recovery  Therapeutic Interventions: Assess for all discharge needs, 1 to 1 time with Social worker, Explore available resources and support systems, Assess for adequacy in community support network, Educate family and significant other(s) on suicide prevention, Complete Psychosocial Assessment, Interpersonal group therapy.  Evaluation of Outcomes: Not Met   Progress in Treatment: Attending groups: No. Participating in groups: No. Taking medication as prescribed: Yes. Toleration medication: Yes. Family/Significant other contact made: No, will contact:  CSW will contact if pt gives permission Patient understands diagnosis: Yes. Discussing patient identified problems/goals with staff: Yes. Medical problems stabilized or resolved: Yes. Denies suicidal/homicidal ideation: Yes. Issues/concerns per patient self-inventory: No. Other: None   New problem(s) identified: No, Describe:  None identified   New Short Term/Long Term Goal(s):detox, elimination of symptoms of psychosis, medication management for mood stabilization; elimination of SI thoughts; development of comprehensive mental wellness/sobriety plan.   Patient Goals:  "I'm not sure to be honest with you. Basically I'm okay for now"   Discharge Plan or Barriers: CSW will assist with appropriate discharge planning   Reason for  Continuation of Hospitalization: Anxiety Depression Suicidal ideation  Estimated Length of Stay: 1 to 7 days  Last 3 [place] Suicide Severity Risk Score: Flowsheet Row Admission (Current) from 07/24/2022 in [facility]PAT[facility] ED from 07/23/2022 in [facility] ED from 06/04/2022 in [facility] Emergency Department at [facility]  C-SSRS RISK CATEGORY No Risk Low Risk No Risk       Last [facility] 2/9 Scores:     No data to display          Scribe for Treatment Team: Elza Rafter, Theresia Majors 07/25/2022 12:25 PM

## 2022-07-25 NOTE — Group Note (Signed)
Date:  07/25/2022 Time:  4:53 PM  Group Topic/Focus:  Activity Group:  Focus of the group is to encourage patients to go outside in the courtyard and get some fresh air to assist with their mental and physical well-being.    Participation Level:  Active  Participation Quality:  Appropriate  Affect:  Appropriate  Cognitive:  Appropriate  Insight: Appropriate  Engagement in Group:  Engaged  Modes of Intervention:  Activity  Additional Comments:    Dalton Harris 07/25/2022, 4:53 PM

## 2022-07-25 NOTE — Group Note (Signed)
Date:  07/25/2022 Time:  9:31 PM  Group Topic/Focus:  Wrap-Up Group:   The focus of this group is to help patients review their daily goal of treatment and discuss progress on daily workbooks.    Participation Level:  Active  Participation Quality:  Appropriate and Attentive  Affect:  Appropriate  Cognitive:  Alert and Appropriate  Insight: Appropriate  Engagement in Group:  Developing/Improving  Modes of Intervention:  Socialization  Additional Comments:     Jolynda Townley 07/25/2022, 9:31 PM

## 2022-07-26 DIAGNOSIS — R45851 Suicidal ideations: Secondary | ICD-10-CM | POA: Diagnosis not present

## 2022-07-26 MED ORDER — QUETIAPINE FUMARATE 100 MG PO TABS
100.0000 mg | ORAL_TABLET | Freq: Every day | ORAL | Status: DC
  Filled 2022-07-26: qty 1

## 2022-07-26 MED ORDER — RISPERIDONE 1 MG PO TABS
0.5000 mg | ORAL_TABLET | ORAL | Status: DC
  Filled 2022-07-26 (×2): qty 1

## 2022-07-26 MED ORDER — LORAZEPAM 0.5 MG PO TABS
0.5000 mg | ORAL_TABLET | ORAL | Status: DC
  Filled 2022-07-26 (×2): qty 1

## 2022-07-26 NOTE — Group Note (Signed)
Recreation Therapy Group Note   Group Topic:Healthy Support Systems  Group Date: 07/26/2022 Start Time: 1000 End Time: 1055 Facilitators: Rosina Lowenstein, LRT, CTRS Location:  Craft Room  Group Description: Straw Bridge.  Patients were given 10 plastic drinking straws and an equal length of masking tape. Using the materials provided, patients were instructed to build a free-standing bridge-like structure to suspend an everyday item (ex: deck of cards) off the floor or table surface. All materials were required to be used in Secondary school teacher. LRT facilitated post-activity discussion reviewing the importance of having strong and healthy support systems in our lives. LRT discussed how the people in our lives serve as the tape and the deck of cards we placed on top of our straw structure are the stressors we face in daily life. LRT and pts discussed what happens in our life when things get too heavy for [place], and we don't have strong supports outside of the hospital. Pt shared 2 of their healthy supports aloud in the group.   Goal Area(s) Addressed:  Patient will identify 2 healthy supports in their life. Patient will identify skills to successfully complete activity. Patient will identify correlation of this activity to life post-discharge.  Patient will work on Product manager.   Affect/Mood: Labile   Participation Level: Active and Engaged   Participation Quality: Minimal Cues   Behavior: Cooperative   Speech/Thought Process: Flight of ideas   Insight: Fair   Judgement: Fair    Modes of Intervention: Activity   Patient Response to Interventions:  Receptive   Education Outcome:  Acknowledges education   Clinical Observations/Individualized Feedback: Dalton Harris was active in their participation of session activities and group discussion. Pt identified "my two dogs and one friend who use to be a drug dealer" as his healthy support system. Pt shared that he is angry that he is  in the hospital, that he would rather go to prison again than come to a place "like this", that if the woman who was living at his house was not taking care of his dogs then he would be going back to prison.    Plan: Continue to engage patient in RT group sessions 2-3x/week.   Rosina Lowenstein, LRT, CTRS 07/26/2022 11:29 AM

## 2022-07-26 NOTE — Progress Notes (Signed)
   07/26/22 0945  Psych Admission Type (Psych Patients Only)  Admission Status Involuntary  Psychosocial Assessment  Patient Complaints None  Eye Contact Brief  Facial Expression Flat  Affect Flat  Speech Logical/coherent  Interaction Assertive  Motor Activity Restless  Appearance/Hygiene Unremarkable  Behavior Characteristics Cooperative  Mood Depressed  Thought Process  Coherency Circumstantial  Content Preoccupation  Delusions None reported or observed  Perception WDL  Hallucination Auditory  Judgment Impaired  Confusion None  Danger to Self  Current suicidal ideation? Denies  Agreement Not to Harm Self Yes  Description of Agreement Verbal  Danger to Others  Danger to Others None reported or observed   Patient adamantly refused new order of Ativan and Risperdal.  Patient insist he is not anxious and there is nothing wrong with him.  He questioned why the doctor keeps ordering medications for him when there is nothing wrong with him. Marland Kitchen

## 2022-07-26 NOTE — Progress Notes (Signed)
[facility] MD Progress Note  07/26/2022 1:06 PM Vaughan Garfinkle  MRN:  [id number] Subjective: Dalton Harris is seen on rounds.  He is very irritable and agitated.  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  He currently denies any suicidal ideation or auditory or visual hallucinations.  He could benefit from a mood stabilizer and an antianxiety medication at the this point.  He says that the police brought him in but he is a voluntary admission. Principal Problem: Suicidal ideation Diagnosis: Principal Problem:   Suicidal ideation  Total Time spent with patient: 15 minutes  Past Psychiatric History: Depression  Past Medical History:  Past Medical History:  Diagnosis Date   Ischemic stroke (HCC)    History reviewed. No pertinent surgical history. Family History: History reviewed. No pertinent family history. Family Psychiatric  History: Unremarkable Social History:  Social History   Substance and Sexual Activity  Alcohol Use Not Currently     Social History   Substance and Sexual Activity  Drug Use Yes   Types: Cocaine    Social History   Socioeconomic History   Marital status: Single    Spouse name: Not on file   Number of children: Not on file   Years of education: Not on file   Highest education level: Not on file  Occupational History   Not on file  Tobacco Use   Smoking status: Never   Smokeless tobacco: Never  Substance and Sexual Activity   Alcohol use: Not Currently   Drug use: Yes    Types: Cocaine   Sexual activity: Not on file  Other Topics Concern   Not on file  Social History Narrative   Not on file   Social Determinants of Health   Financial Resource Strain: Not on file  Food Insecurity: Patient Declined (07/24/2022)   Hunger Vital Sign    Worried About Running Out of Food in the Last Year: Patient declined    Ran Out of Food in the Last Year: Patient declined  Recent Concern: Food Insecurity - Food Insecurity Present (07/23/2022)    Hunger Vital Sign    Worried About Running Out of Food in the Last Year: Often true    Ran Out of Food in the Last Year: Often true  Transportation Needs: Patient Declined (07/24/2022)   PRAPARE - Administrator, Civil Service (Medical): Patient declined    Lack of Transportation (Non-Medical): Patient declined  Recent Concern: Transportation Needs - Unmet Transportation Needs (07/23/2022)   PRAPARE - Administrator, Civil Service (Medical): Yes    Lack of Transportation (Non-Medical): Yes  Physical Activity: Not on file  Stress: Not on file  Social Connections: Not on file   Additional Social History:                         Sleep: Fair  Appetite:  Fair  Current Medications: Current Facility-Administered Medications  Medication Dose Route Frequency Provider Last Rate Last Admin   acetaminophen (TYLENOL) tablet 650 mg  650 mg Oral Q6H PRN Bobbitt, Shalon E, NP       alum & mag hydroxide-simeth (MAALOX/MYLANTA) 200-200-20 MG/5ML suspension 30 mL  30 mL Oral Q4H PRN Bobbitt, Shalon E, NP       diphenhydrAMINE (BENADRYL) capsule 50 mg  50 mg Oral TID PRN Bobbitt, Shalon E, NP       Or   diphenhydrAMINE (BENADRYL) injection 50 mg  50 mg Intramuscular TID  PRN Bobbitt, Shalon E, NP       haloperidol (HALDOL) tablet 5 mg  5 mg Oral TID PRN Bobbitt, Shalon E, NP       Or   haloperidol lactate (HALDOL) injection 5 mg  5 mg Intramuscular TID PRN Bobbitt, Shalon E, NP       LORazepam (ATIVAN) tablet 2 mg  2 mg Oral TID PRN Bobbitt, Shalon E, NP       Or   LORazepam (ATIVAN) injection 2 mg  2 mg Intramuscular TID PRN Bobbitt, Shalon E, NP       LORazepam (ATIVAN) tablet 0.5 mg  0.5 mg Oral [id number] Tansy Lorek Edward, DO       magnesium hydroxide (MILK OF MAGNESIA) suspension 30 mL  30 mL Oral Daily PRN Bobbitt, Shalon E, NP       risperiDONE (RISPERDAL) tablet 0.5 mg  0.5 mg Oral [id number] Edsel Shives Edward, DO       sertraline (ZOLOFT) tablet 50 mg   50 mg Oral Daily Rex Kras, MD   50 mg at 07/26/22 1012   traZODone (DESYREL) tablet 50 mg  50 mg Oral QHS PRN Bobbitt, Shalon E, NP   50 mg at 07/25/22 2118    Lab Results: No results found for this or any previous visit (from the past 48 hour(s)).  Blood Alcohol level:  Lab Results  Component Value Date   ETH <10 02/17/2021   ETH <10 09/08/2020    Metabolic Disorder Labs: No results found for: "HGBA1C", "MPG" No results found for: "PROLACTIN" No results found for: "CHOL", "TRIG", "HDL", "CHOLHDL", "VLDL", "LDLCALC"  Physical Findings: AIMS:  , ,  ,  ,    CIWA:    COWS:     Musculoskeletal: Strength & Muscle Tone: within normal limits Gait & Station: normal Patient leans: N/A  Psychiatric Specialty Exam:  Presentation  General Appearance:  Bizarre; Casual  Eye Contact: Fair  Speech: Garbled  Speech Volume: Normal  Handedness: Right   Mood and Affect  Mood: Irritable  Affect: Labile   Thought Process  Thought Processes: Disorganized  Descriptions of Associations:Intact  Orientation:Full (Time, Place and Person)  Thought Content:Illogical  History of Schizophrenia/Schizoaffective disorder:Yes  Duration of Psychotic Symptoms:No data recorded Hallucinations:Hallucinations: None  Ideas of Reference:None  Suicidal Thoughts:Suicidal Thoughts: Yes, Passive SI Passive Intent and/or Plan: Without Intent; Without Plan  Homicidal Thoughts:Homicidal Thoughts: No   Sensorium  Memory: Immediate Fair; Recent Poor; Remote Fair  Judgment: Poor  Insight: Poor   Executive Functions  Concentration: Fair  Attention Span: Fair  Recall: Fair  Fund of Knowledge: Fair  Language: Fair   Psychomotor Activity  Psychomotor Activity: Psychomotor Activity: Normal   Assets  Assets: Manufacturing systems engineer; Desire for Improvement   Sleep  Sleep: Sleep: Fair     Blood pressure 129/77, pulse (!) 55, temperature 97.6 F (36.4  C), temperature source Oral, resp. rate 18, height 5\' 11"  (1.803 m), weight 68.5 kg, SpO2 99 %. Body mass index is 21.06 kg/m.   Treatment Plan Summary: Daily contact with patient to assess and evaluate symptoms and progress in treatment, Medication management, and Plan add Ativan 0.5 mg twice a day and Risperdal 0.5 mg twice a day.  Seroquel 100 mg at bedtime.  Sarina Ill, DO 07/26/2022, 1:06 PM

## 2022-07-26 NOTE — Progress Notes (Signed)
Patient calm and pleasant during assessment denying SI/HI/AVH. Pt observed interacting appropriately with staff and peers on the unit. Pt compliant with medication administration per MD orders. Pt given education, support, and encouragement to be active in his treatment plan. Pt being monitored Q 15 minutes for safety per unit protocol, remains safe on the unit  

## 2022-07-26 NOTE — Group Note (Signed)
Date:  07/26/2022 Time:  10:42 PM  Group Topic/Focus:  Healthy Communication:   The focus of this group is to discuss communication, barriers to communication, as well as healthy ways to communicate with others.    Participation Level:  Active  Participation Quality:  Appropriate  Affect:  Appropriate  Cognitive:  Alert and Appropriate  Insight: Good  Engagement in Group:  Developing/Improving  Modes of Intervention:  Socialization and Support  Additional Comments:     Kari Kerth 07/26/2022, 10:42 PM

## 2022-07-26 NOTE — Plan of Care (Signed)
  Problem: Medication: Goal: Compliance with prescribed medication regimen will improve Outcome: Progressing   Problem: Education: Goal: Knowledge of [place] [facility] Education information/materials will improve Outcome: Progressing   

## 2022-07-27 DIAGNOSIS — F19950 Other psychoactive substance use, unspecified with psychoactive substance-induced psychotic disorder with delusions: Principal | ICD-10-CM | POA: Insufficient documentation

## 2022-07-27 DIAGNOSIS — R45851 Suicidal ideations: Secondary | ICD-10-CM | POA: Diagnosis not present

## 2022-07-27 LAB — LIPID PANEL
Cholesterol: 182 mg/dL (ref 0–200)
HDL: 57 mg/dL (ref >40)
LDL Cholesterol: 107 mg/dL — ABNORMAL HIGH (ref 0–99)
Total CHOL/HDL Ratio: 3.2 RATIO
Triglycerides: 92 mg/dL (ref <150)
VLDL: 18 mg/dL (ref 0–40)

## 2022-07-27 NOTE — Group Note (Signed)
Date:  07/27/2022 Time:  10:01 AM  Group Topic/Focus:  Goals Group:   The focus of this group is to help patients establish daily goals to achieve during treatment and discuss how the patient can incorporate goal setting into their daily lives to aide in recovery.    Participation Level:  Did Not Attend   Christoher Drudge Travis Lydon Vansickle 07/27/2022, 10:01 AM  

## 2022-07-27 NOTE — Group Note (Signed)
Recreation Therapy Group Note   Group Topic:Leisure Education  Group Date: 07/27/2022 Start Time: 1000 End Time: 1105 Facilitators: Rosina Lowenstein, LRT, CTRS Location:  Day Room  Group Description: Leisure. Patients were given the option to choose from coloring, singing karaoke, or playing cards. LRT and pts discussed the meaning of leisure, the importance of participating in leisure during their free time/when they're outside of the hospital, as well as how our leisure interests can also serve as coping skills. Pt identified two leisure interests and shared with the group.   Goal Area(s) Addressed:  Patient will identify a current leisure interest.  Patient will learn the definition of "leisure". Patient will practice making a positive decision. Patient will have the opportunity to try a new leisure activity. Patient will communicate with peers and LRT.   Affect/Mood: N/A   Participation Level: Did not attend    Clinical Observations/Individualized Feedback: Laman did not attend group.  Plan: Continue to engage patient in RT group sessions 2-3x/week.   Rosina Lowenstein, LRT, CTRS 07/27/2022 11:47 AM

## 2022-07-27 NOTE — Progress Notes (Signed)
   07/27/22 1052  Psych Admission Type (Psych Patients Only)  Admission Status Involuntary  Psychosocial Assessment  Patient Complaints Anger  Eye Contact Brief  Facial Expression Flat  Affect Irritable  Speech Argumentative  Interaction Hostile  Motor Activity Restless  Appearance/Hygiene Unremarkable  Behavior Characteristics Anxious  Mood Depressed  Thought Process  Coherency Circumstantial  Content Preoccupation  Delusions None reported or observed  Perception WDL  Hallucination Auditory  Judgment Impaired  Confusion None  Danger to Self  Current suicidal ideation? Denies  Agreement Not to Harm Self Yes  Description of Agreement Verbal  Danger to Others  Danger to Others None reported or observed   This morning patient was uncooperative.  Patient was hostile and refused his morning meds.  Patient stated he did not need them.  At this time patient continues to refuse scheduled medications.  Patient is pleasant.  He refused his meds but was not hostile about it. Patient stated all the Meth is out of his system so he does not need more meds from here. Patient chatted about he went outside and watched some TV today.
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
## 2022-07-27 NOTE — Progress Notes (Signed)
[facility] MD Progress Note  07/27/2022 11:55 AM Dalton Harris  MRN:  [id number] Subjective: Dalton Harris was seen on rounds.  He is refusing his medications and states that he is not depressed or suicidal and he does not hear voices.  His roommates called the police after he was acting bizarre after using methamphetamine.  He states he does not use it that often.  He has poor insight.  He says he does not need any medications.  Vital signs are stable Principal Problem: Substance-induced psychotic disorder with delusions (HCC) Diagnosis: Principal Problem:   Substance-induced psychotic disorder with delusions (HCC) Active Problems:   Suicidal ideation  Total Time spent with patient: 15 minutes  Past Psychiatric History: Substance abuse and personality disorder  Past Medical History:  Past Medical History:  Diagnosis Date   Ischemic stroke (HCC)    History reviewed. No pertinent surgical history. Family History: History reviewed. No pertinent family history. Family Psychiatric  History: Unremarkable Social History:  Social History   Substance and Sexual Activity  Alcohol Use Not Currently     Social History   Substance and Sexual Activity  Drug Use Yes   Types: Cocaine    Social History   Socioeconomic History   Marital status: Single    Spouse name: Not on file   Number of children: Not on file   Years of education: Not on file   Highest education level: Not on file  Occupational History   Not on file  Tobacco Use   Smoking status: Never   Smokeless tobacco: Never  Substance and Sexual Activity   Alcohol use: Not Currently   Drug use: Yes    Types: Cocaine   Sexual activity: Not on file  Other Topics Concern   Not on file  Social History Narrative   Not on file   Social Determinants of Health   Financial Resource Strain: Not on file  Food Insecurity: Patient Declined (07/24/2022)   Hunger Vital Sign    Worried About Running Out of Food in the Last Year: Patient  declined    Ran Out of Food in the Last Year: Patient declined  Recent Concern: Food Insecurity - Food Insecurity Present (07/23/2022)   Hunger Vital Sign    Worried About Running Out of Food in the Last Year: Often true    Ran Out of Food in the Last Year: Often true  Transportation Needs: Patient Declined (07/24/2022)   PRAPARE - Administrator, Civil Service (Medical): Patient declined    Lack of Transportation (Non-Medical): Patient declined  Recent Concern: Transportation Needs - Unmet Transportation Needs (07/23/2022)   PRAPARE - Administrator, Civil Service (Medical): Yes    Lack of Transportation (Non-Medical): Yes  Physical Activity: Not on file  Stress: Not on file  Social Connections: Not on file   Additional Social History:                         Sleep: Good  Appetite:  Good  Current Medications: Current Facility-Administered Medications  Medication Dose Route Frequency Provider Last Rate Last Admin   acetaminophen (TYLENOL) tablet 650 mg  650 mg Oral Q6H PRN Bobbitt, Shalon E, NP       alum & mag hydroxide-simeth (MAALOX/MYLANTA) 200-200-20 MG/5ML suspension 30 mL  30 mL Oral Q4H PRN Bobbitt, Shalon E, NP       diphenhydrAMINE (BENADRYL) capsule 50 mg  50 mg Oral TID PRN Bobbitt, Shalon  E, NP       Or   diphenhydrAMINE (BENADRYL) injection 50 mg  50 mg Intramuscular TID PRN Bobbitt, Shalon E, NP       haloperidol (HALDOL) tablet 5 mg  5 mg Oral TID PRN Bobbitt, Shalon E, NP       Or   haloperidol lactate (HALDOL) injection 5 mg  5 mg Intramuscular TID PRN Bobbitt, Shalon E, NP       LORazepam (ATIVAN) tablet 2 mg  2 mg Oral TID PRN Bobbitt, Shalon E, NP       Or   LORazepam (ATIVAN) injection 2 mg  2 mg Intramuscular TID PRN Bobbitt, Shalon E, NP       LORazepam (ATIVAN) tablet 0.5 mg  0.5 mg Oral BH-q8a4p Sathvika Ojo Edward, DO       magnesium hydroxide (MILK OF MAGNESIA) suspension 30 mL  30 mL Oral Daily PRN Bobbitt, Shalon  E, NP       QUEtiapine (SEROQUEL) tablet 100 mg  100 mg Oral QHS Terreon Ekholm Edward, DO       risperiDONE (RISPERDAL) tablet 0.5 mg  0.5 mg Oral BH-q8a4p Trevaughn Schear Edward, DO       sertraline (ZOLOFT) tablet 50 mg  50 mg Oral Daily Rex Kras, MD   50 mg at 07/26/22 1012   traZODone (DESYREL) tablet 50 mg  50 mg Oral QHS PRN Bobbitt, Shalon E, NP   50 mg at 07/26/22 2105    Lab Results:  Results for orders placed or performed during the hospital encounter of 07/24/22 (from the past 48 hour(s))  Lipid panel     Status: Abnormal   Collection Time: 07/27/22  6:54 AM  Result Value Ref Range   Cholesterol 182 0 - 200 mg/dL   Triglycerides 92 <161 mg/dL   HDL 57 >09 mg/dL   Total CHOL/HDL Ratio 3.2 RATIO   VLDL 18 0 - 40 mg/dL   LDL Cholesterol 604 (H) 0 - 99 mg/dL    Comment:        Total Cholesterol/HDL:CHD Risk Coronary Heart Disease Risk Table                     Men   Women  1/2 Average Risk   3.4   3.3  Average Risk       5.0   4.4  2 X Average Risk   9.6   7.1  3 X Average Risk  23.4   11.0        Use the calculated Patient Ratio above and the CHD Risk Table to determine the patient's CHD Risk.        ATP III CLASSIFICATION (LDL):  <100     mg/dL   Optimal  540-981  mg/dL   Near or Above                    Optimal  130-159  mg/dL   Borderline  191-478  mg/dL   High  >295     mg/dL   Very High Performed at [facility], [address] [place], [place] [address]     Blood Alcohol level:  Lab Results  Component Value Date   [facility] <10 02/17/2021   ETH <10 09/08/2020    Metabolic Disorder Labs: No results found for: "HGBA1C", "MPG" No results found for: "PROLACTIN" Lab Results  Component Value Date   CHOL 182 07/27/2022   TRIG 92 07/27/2022   HDL  57 07/27/2022   CHOLHDL 3.2 07/27/2022   VLDL 18 07/27/2022   LDLCALC 107 (H) 07/27/2022    Physical Findings: AIMS:  , ,  ,  ,    CIWA:    COWS:     Musculoskeletal: Strength &  Muscle Tone: within normal limits Gait & Station: normal Patient leans: N/A  Psychiatric Specialty Exam:  Presentation  General Appearance:  Bizarre; Casual  Eye Contact: Fair  Speech: Garbled  Speech Volume: Normal  Handedness: Right   Mood and Affect  Mood: Irritable  Affect: Labile   Thought Process  Thought Processes: Disorganized  Descriptions of Associations:Intact  Orientation:Full (Time, Place and Person)  Thought Content:Illogical  History of Schizophrenia/Schizoaffective disorder:Yes  Duration of Psychotic Symptoms:No data recorded Hallucinations:No data recorded Ideas of Reference:None  Suicidal Thoughts:No data recorded Homicidal Thoughts:No data recorded  Sensorium  Memory: Immediate Fair; Recent Poor; Remote Fair  Judgment: Poor  Insight: Poor   Executive Functions  Concentration: Fair  Attention Span: Fair  Recall: [employer] of Knowledge: Fair  Language: Fair   Psychomotor Activity  Psychomotor Activity:No data recorded  Assets  Assets: Communication Skills; Desire for Improvement   Sleep  Sleep:No data recorded    Blood pressure 120/63, pulse (!) 57, temperature 97.9 F (36.6 C), temperature source Oral, resp. rate 18, height 5\' 11"  (1.803 m), weight 68.5 kg, SpO2 98 %. Body mass index is 21.06 kg/m.   Treatment Plan Summary: Daily contact with patient to assess and evaluate symptoms and progress in treatment, Medication management, and Plan continue current medications.  Sarina Ill, DO 07/27/2022, 11:55 AM

## 2022-07-27 NOTE — BHH Counselor (Addendum)
CSW completed PSA with pt. At the end of PSA, CSW offered to set up a follow-up appointment with outpatient therapy or psychiatry. Pt declined at this time.  CSW will attempt again before discharge.   Reynaldo Minium, MSW, [place] 07/27/2022 11:09 AM

## 2022-07-28 DIAGNOSIS — R45851 Suicidal ideations: Secondary | ICD-10-CM | POA: Diagnosis not present

## 2022-07-28 LAB — HEMOGLOBIN A1C
Hgb A1c MFr Bld: 5.8 % — ABNORMAL HIGH (ref 4.8–5.6)
Mean Plasma Glucose: 120 mg/dL

## 2022-07-28 NOTE — Plan of Care (Signed)
  Problem: Education: Goal: Knowledge of [facility] Education information/materials will improve Outcome: Progressing Goal: Emotional status will improve Outcome: Progressing Goal: Mental status will improve Outcome: Progressing Goal: Verbalization of understanding the information provided will improve Outcome: Progressing   

## 2022-07-28 NOTE — Progress Notes (Signed)
[facility] MD Progress Note  07/28/2022 2:40 PM Dalton Harris  MRN:  [id number] Subjective: Dalton Harris is seen on rounds.  He is in better controls and less agitated.  He still refusing to take any medications..  He states that he does not need them and he does not feel depressed.  Apparently, he became psychotic after using methamphetamine but he is at baseline now. Principal Problem: Substance-induced psychotic disorder with delusions (HCC) Diagnosis: Principal Problem:   Substance-induced psychotic disorder with delusions (HCC) Active Problems:   Suicidal ideation  Total Time spent with patient: 15 minutes  Past Psychiatric History: Unremarkable  Past Medical History:  Past Medical History:  Diagnosis Date   Ischemic stroke (HCC)    History reviewed. No pertinent surgical history. Family History: History reviewed. No pertinent family history. Family Psychiatric  History: Unremarkable Social History:  Social History   Substance and Sexual Activity  Alcohol Use Not Currently     Social History   Substance and Sexual Activity  Drug Use Yes   Types: Cocaine    Social History   Socioeconomic History   Marital status: Single    Spouse name: Not on file   Number of children: Not on file   Years of education: Not on file   Highest education level: Not on file  Occupational History   Not on file  Tobacco Use   Smoking status: Never   Smokeless tobacco: Never  Substance and Sexual Activity   Alcohol use: Not Currently   Drug use: Yes    Types: Cocaine   Sexual activity: Not on file  Other Topics Concern   Not on file  Social History Narrative   Not on file   Social Determinants of Health   Financial Resource Strain: Not on file  Food Insecurity: Patient Declined (07/24/2022)   Hunger Vital Sign    Worried About Running Out of Food in the Last Year: Patient declined    Ran Out of Food in the Last Year: Patient declined  Recent Concern: Food Insecurity - Food Insecurity  Present (07/23/2022)   Hunger Vital Sign    Worried About Running Out of Food in the Last Year: Often true    Ran Out of Food in the Last Year: Often true  Transportation Needs: Patient Declined (07/24/2022)   PRAPARE - Administrator, Civil Service (Medical): Patient declined    Lack of Transportation (Non-Medical): Patient declined  Recent Concern: Transportation Needs - Unmet Transportation Needs (07/23/2022)   PRAPARE - Administrator, Civil Service (Medical): Yes    Lack of Transportation (Non-Medical): Yes  Physical Activity: Not on file  Stress: Not on file  Social Connections: Not on file   Additional Social History:                         Sleep: Good  Appetite:  Good  Current Medications: Current Facility-Administered Medications  Medication Dose Route Frequency Provider Last Rate Last Admin   acetaminophen (TYLENOL) tablet 650 mg  650 mg Oral Q6H PRN Bobbitt, Shalon E, NP       alum & mag hydroxide-simeth (MAALOX/MYLANTA) 200-200-20 MG/5ML suspension 30 mL  30 mL Oral Q4H PRN Bobbitt, Shalon E, NP       diphenhydrAMINE (BENADRYL) capsule 50 mg  50 mg Oral TID PRN Bobbitt, Shalon E, NP       Or   diphenhydrAMINE (BENADRYL) injection 50 mg  50 mg Intramuscular TID PRN  Bobbitt, Shalon E, NP       haloperidol (HALDOL) tablet 5 mg  5 mg Oral TID PRN Bobbitt, Shalon E, NP       Or   haloperidol lactate (HALDOL) injection 5 mg  5 mg Intramuscular TID PRN Bobbitt, Shalon E, NP       LORazepam (ATIVAN) tablet 2 mg  2 mg Oral TID PRN Bobbitt, Shalon E, NP       Or   LORazepam (ATIVAN) injection 2 mg  2 mg Intramuscular TID PRN Bobbitt, Shalon E, NP       LORazepam (ATIVAN) tablet 0.5 mg  0.5 mg Oral BH-q8a4p Sandy Haye Edward, DO       magnesium hydroxide (MILK OF MAGNESIA) suspension 30 mL  30 mL Oral Daily PRN Bobbitt, Shalon E, NP       QUEtiapine (SEROQUEL) tablet 100 mg  100 mg Oral QHS Reia Viernes Edward, DO       risperiDONE  (RISPERDAL) tablet 0.5 mg  0.5 mg Oral BH-q8a4p Toryn Mcclinton Edward, DO       sertraline (ZOLOFT) tablet 50 mg  50 mg Oral Daily Rex Kras, MD   50 mg at 07/26/22 1012   traZODone (DESYREL) tablet 50 mg  50 mg Oral QHS PRN Bobbitt, Shalon E, NP   50 mg at 07/27/22 2112    Lab Results:  Results for orders placed or performed during the hospital encounter of 07/24/22 (from the past 48 hour(s))  Lipid panel     Status: Abnormal   Collection Time: 07/27/22  6:54 AM  Result Value Ref Range   Cholesterol 182 0 - 200 mg/dL   Triglycerides 92 <147 mg/dL   HDL 57 >82 mg/dL   Total CHOL/HDL Ratio 3.2 RATIO   VLDL 18 0 - 40 mg/dL   LDL Cholesterol 956 (H) 0 - 99 mg/dL    Comment:        Total Cholesterol/HDL:CHD Risk Coronary Heart Disease Risk Table                     Men   Women  1/2 Average Risk   3.4   3.3  Average Risk       5.0   4.4  2 X Average Risk   9.6   7.1  3 X Average Risk  23.4   11.0        Use the calculated Patient Ratio above and the CHD Risk Table to determine the patient's CHD Risk.        ATP III CLASSIFICATION (LDL):  <100     mg/dL   Optimal  213-086  mg/dL   Near or Above                    Optimal  130-159  mg/dL   Borderline  578-469  mg/dL   High  >629     mg/dL   Very High Performed at [facility], [address] [place], [place] [address]   Hemoglobin A1c     Status: Abnormal   Collection Time: 07/27/22  6:54 AM  Result Value Ref Range   Hgb A1c MFr Bld 5.8 (H) 4.8 - 5.6 %    Comment: (NOTE)         Prediabetes: 5.7 - 6.4         Diabetes: >6.4         Glycemic control for adults with diabetes: <7.0    Mean Plasma  Glucose 120 mg/dL    Comment: (NOTE) Performed At: [facility] [address] [place], [place] [id number] Jolene Schimke MD WU:[id number]     Blood Alcohol level:  Lab Results  Component Value Date   [facility] <10 02/17/2021   ETH <10 09/08/2020    Metabolic Disorder Labs: Lab Results  Component  Value Date   HGBA1C 5.8 (H) 07/27/2022   MPG 120 07/27/2022   No results found for: "PROLACTIN" Lab Results  Component Value Date   CHOL 182 07/27/2022   TRIG 92 07/27/2022   HDL 57 07/27/2022   CHOLHDL 3.2 07/27/2022   VLDL 18 07/27/2022   LDLCALC 107 (H) 07/27/2022    Physical Findings: AIMS:  , ,  ,  ,    CIWA:    COWS:     Musculoskeletal: Strength & Muscle Tone: within normal limits Gait & Station: normal Patient leans: N/A  Psychiatric Specialty Exam:  Presentation  General Appearance:  Bizarre; Casual  Eye Contact: Fair  Speech: Garbled  Speech Volume: Normal  Handedness: Right   Mood and Affect  Mood: Irritable  Affect: Labile   Thought Process  Thought Processes: Disorganized  Descriptions of Associations:Intact  Orientation:Full (Time, Place and Person)  Thought Content:Illogical  History of Schizophrenia/Schizoaffective disorder:Yes  Duration of Psychotic Symptoms:No data recorded Hallucinations:No data recorded Ideas of Reference:None  Suicidal Thoughts:No data recorded Homicidal Thoughts:No data recorded  Sensorium  Memory: Immediate Fair; Recent Poor; Remote Fair  Judgment: Poor  Insight: Poor   Executive Functions  Concentration: Fair  Attention Span: Fair  Recall: [employer] of Knowledge: Fair  Language: Fair   Psychomotor Activity  Psychomotor Activity:No data recorded  Assets  Assets: Communication Skills; Desire for Improvement   Sleep  Sleep:No data recorded   Blood pressure 120/66, pulse 62, temperature 97.8 F (36.6 C), temperature source Oral, resp. rate (!) 22, height 5\' 11"  (1.803 m), weight 68.5 kg, SpO2 100 %. Body mass index is 21.06 kg/m.   Treatment Plan Summary: Daily contact with patient to assess and evaluate symptoms and progress in treatment, Medication management, and Plan encourage medication compliance.  Sarina Ill, DO 07/28/2022, 2:40 PM

## 2022-07-28 NOTE — Progress Notes (Signed)
D: Patient alert and oriented, able to make needs known. Denies SI/HI, AVH at present. Denies pain at present. Patient goal today "to go home." Rates depression 0/10, hopelessness 0/10, and anxiety 0/10. Patient reports energy level as normal. He reports he slept good last night. Patient does not request any PRN medication at this time.   A: Patient refusing medications, states he does not need any medications. Support and encouragement provided. Routine safety checks conducted every fifteen minutes. Patient informed to notify staff with problems or concerns. Frequent verbal contact made.   R: Patient contracts for safety at this time. Patient receptive, calm and cooperative. Patient interacts with others appropriately on unit at present. Patient remains safe at present.

## 2022-07-28 NOTE — Group Note (Signed)
LCSW Group Therapy Note  Group Date: 07/28/2022 Start Time: 1315 End Time: 1355   Type of Therapy and Topic:  Group Therapy - Coping Skills  Participation Level:  Minimal   Description of Group The focus of this group was to determine what unhealthy coping techniques typically are used by group members and what healthy coping techniques would be helpful in coping with various problems. Patients were guided in becoming aware of the differences between healthy and unhealthy coping techniques. Patients were asked to identify 2-3 healthy coping skills they would like to learn to use more effectively.  Therapeutic Goals Patients learned that coping is what human beings do all day long to deal with various situations in their lives Patients defined and discussed healthy vs unhealthy coping techniques Patients identified their preferred coping techniques and identified whether these were healthy or unhealthy Patients determined 2-3 healthy coping skills they would like to become more familiar with and use more often. Patients provided support and ideas to each other   Summary of Patient Progress:  The patient attended group. The patient stated that he would not explore neither space or the ocean. The patient left group half way into group.     Marshell Levan, LCSWA 07/28/2022  2:27 PM

## 2022-07-28 NOTE — Group Note (Signed)
Date:  07/28/2022 Time:  10:29 PM  Group Topic/Focus:  Wrap-Up Group:   The focus of this group is to help patients review their daily goal of treatment and discuss progress on daily workbooks.    Participation Level:  Active  Participation Quality:  Appropriate and Monopolizing  Affect:  Appropriate  Cognitive:  Alert  Insight: Appropriate  Engagement in Group:  Engaged and Monopolizing  Modes of Intervention:  Activity  Additional Comments:     Maglione,Markcus Lazenby E 07/28/2022, 10:29 PM

## 2022-07-28 NOTE — Progress Notes (Signed)
Patient is alert and oriented x 4, he denies SI/HI/AVH, he was complaint with medication regimen no distress noted, he is interacting appropriately with peers and staff, 15 minutes safety checks maintained will continue to monitor closely.

## 2022-07-28 NOTE — Group Note (Signed)
Date:  07/28/2022 Time:  6:40 PM  Group Topic/Focus:  Self Care:   The focus of this group is to help patients understand the importance of self-care in order to improve or restore emotional, physical, spiritual, interpersonal, and financial health. Gratitude assertiveness.     Participation Level:  Did Not Attend   Doug Sou 07/28/2022, 6:40 PM

## 2022-07-28 NOTE — Group Note (Signed)
Date:  07/28/2022 Time:  10:52 AM  Group Topic/Focus:  Music Therapy- Outside group time, working and building team skills by playing basketball  and other group activities.    Participation Level:  Did Not Attend   Sabrena Gavitt L Julie Nay 07/28/2022, 10:52 AM  

## 2022-07-29 DIAGNOSIS — R45851 Suicidal ideations: Secondary | ICD-10-CM | POA: Diagnosis not present

## 2022-07-29 NOTE — Plan of Care (Signed)
  Problem: Coping: Goal: Ability to verbalize frustrations and anger appropriately will improve Outcome: Progressing Goal: Ability to demonstrate self-control will improve Outcome: Progressing   Problem: Health Behavior/Discharge Planning: Goal: Identification of resources available to assist in meeting health care needs will improve Outcome: Not Progressing Goal: Compliance with treatment plan for underlying cause of condition will improve Outcome: Not Progressing   Problem: Physical Regulation: Goal: Ability to maintain clinical measurements within normal limits will improve Outcome: Progressing   Problem: Safety: Goal: Periods of time without injury will increase Outcome: Progressing

## 2022-07-29 NOTE — Group Note (Signed)
Date:  07/29/2022 Time:  3:49 PM  Group Topic/Focus:  Activity Group:  The purpose of this group is to encourage patients to come outside to the courtyard to get some fresh air for the support of their physical and mental wellbeing.    Participation Level:  Active  Participation Quality:  Appropriate  Affect:  Appropriate  Cognitive:  Appropriate  Insight: Appropriate  Engagement in Group:  Engaged  Modes of Intervention:  Activity  Additional Comments:    Abdulmalik Darco M Damiean Lukes 07/29/2022, 3:49 PM  

## 2022-07-29 NOTE — Progress Notes (Signed)
D: Patient alert and oriented, able to make needs known. Denies SI/HI, AVH at present. Denies pain at present. Patient goal today "go home" Rates depression 0/10, hopelessness 0/10, and anxiety 0/10. Patient reports energy level as normal. He reports he slept well last night. Patient does not request any PRN medication at this time.   A: Dalton Harris continues to refuse his scheduled medications. Support and encouragement provided. Routine safety checks conducted every fifteen minutes. Patient informed to notify staff with problems or concerns. Frequent verbal contact made.   R: Patient contracts for safety at this time. Patient receptive, calm and cooperative. Patient interacts with others appropriately on unit at present. Patient remains safe at present.

## 2022-07-29 NOTE — Group Note (Signed)
Date:  07/29/2022 Time:  10:09 AM  Group Topic/Focus:  Activity Group:  The focus of this group is to encourage patients to come outside to the courtyard to assist with their physical and mental wellbeing.    Participation Level:  Active  Participation Quality:  Appropriate  Affect:  Appropriate  Cognitive:  Appropriate  Insight: Appropriate  Engagement in Group:  Engaged  Modes of Intervention:  Activity  Additional Comments:    Dalton Harris 07/29/2022, 10:09 AM

## 2022-07-29 NOTE — Progress Notes (Signed)
Patient alert an oriented x 4, affect is blunted he denies SI/HI/AVH interacting appropriately with peers and staff. Patient was complaint with medication regimen, he was offered emotional support and encouragement, 15 minutes safety checks maintained will continue to monitor .

## 2022-07-29 NOTE — Progress Notes (Signed)
[facility] MD Progress Note  07/29/2022 2:28 PM Dalton Harris  MRN:  [id number] Subjective: Dalton Harris is seen on rounds.  He is been in good controls.  He is less agitated.  He continues to refuse medications stating that he is not depressed and he does not need them.  He denies any suicidal ideation. Principal Problem: Substance-induced psychotic disorder with delusions (HCC) Diagnosis: Principal Problem:   Substance-induced psychotic disorder with delusions (HCC) Active Problems:   Suicidal ideation  Total Time spent with patient: 15 minutes  Past Psychiatric History: Substance abuse and depression.  Past Medical History:  Past Medical History:  Diagnosis Date   Ischemic stroke [facility])    History reviewed. No pertinent surgical history. Family History: History reviewed. No pertinent family history. Family Psychiatric  History: Unremarkable Social History:  Social History   Substance and Sexual Activity  Alcohol Use Not Currently     Social History   Substance and Sexual Activity  Drug Use Yes   Types: Cocaine    Social History   Socioeconomic History   Marital status: Single    Spouse name: Not on file   Number of children: Not on file   Years of education: Not on file   Highest education level: Not on file  Occupational History   Not on file  Tobacco Use   Smoking status: Never   Smokeless tobacco: Never  Substance and Sexual Activity   Alcohol use: Not Currently   Drug use: Yes    Types: Cocaine   Sexual activity: Not on file  Other Topics Concern   Not on file  Social History Narrative   Not on file   Social Determinants of Health   Financial Resource Strain: Not on file  Food Insecurity: Patient Declined (07/24/2022)   Hunger Vital Sign    Worried About Running Out of Food in the Last Year: Patient declined    Ran Out of Food in the Last Year: Patient declined  Recent Concern: Food Insecurity - Food Insecurity Present (07/23/2022)   Hunger Vital Sign     Worried About Running Out of Food in the Last Year: Often true    Ran Out of Food in the Last Year: Often true  Transportation Needs: Patient Declined (07/24/2022)   PRAPARE - Administrator, Civil Service (Medical): Patient declined    Lack of Transportation (Non-Medical): Patient declined  Recent Concern: Transportation Needs - Unmet Transportation Needs (07/23/2022)   PRAPARE - Administrator, Civil Service (Medical): Yes    Lack of Transportation (Non-Medical): Yes  Physical Activity: Not on file  Stress: Not on file  Social Connections: Not on file   Additional Social History:                         Sleep: Good  Appetite:  Good  Current Medications: Current Facility-Administered Medications  Medication Dose Route Frequency Provider Last Rate Last Admin   acetaminophen (TYLENOL) tablet 650 mg  650 mg Oral Q6H PRN Bobbitt, Shalon E, NP       alum & mag hydroxide-simeth (MAALOX/MYLANTA) 200-200-20 MG/5ML suspension 30 mL  30 mL Oral Q4H PRN Bobbitt, Shalon E, NP       diphenhydrAMINE (BENADRYL) capsule 50 mg  50 mg Oral TID PRN Bobbitt, Shalon E, NP       Or   diphenhydrAMINE (BENADRYL) injection 50 mg  50 mg Intramuscular TID PRN Bobbitt, Franchot Mimes, NP  haloperidol (HALDOL) tablet 5 mg  5 mg Oral TID PRN Bobbitt, Shalon E, NP       Or   haloperidol lactate (HALDOL) injection 5 mg  5 mg Intramuscular TID PRN Bobbitt, Shalon E, NP       LORazepam (ATIVAN) tablet 2 mg  2 mg Oral TID PRN Bobbitt, Shalon E, NP       Or   LORazepam (ATIVAN) injection 2 mg  2 mg Intramuscular TID PRN Bobbitt, Shalon E, NP       LORazepam (ATIVAN) tablet 0.5 mg  0.5 mg Oral [id number] Rayane Gallardo Edward, DO       magnesium hydroxide (MILK OF MAGNESIA) suspension 30 mL  30 mL Oral Daily PRN Bobbitt, Shalon E, NP       QUEtiapine (SEROQUEL) tablet 100 mg  100 mg Oral QHS Kalif Kattner Edward, DO       risperiDONE (RISPERDAL) tablet 0.5 mg  0.5 mg Oral [id number]  Summerlyn Fickel Edward, DO       sertraline (ZOLOFT) tablet 50 mg  50 mg Oral Daily Rex Kras, MD   50 mg at 07/26/22 1012   traZODone (DESYREL) tablet 50 mg  50 mg Oral QHS PRN Bobbitt, Shalon E, NP   50 mg at 07/28/22 2155    Lab Results: No results found for this or any previous visit (from the past 48 hour(s)).  Blood Alcohol level:  Lab Results  Component Value Date   ETH <10 02/17/2021   ETH <10 09/08/2020    Metabolic Disorder Labs: Lab Results  Component Value Date   HGBA1C 5.8 (H) 07/27/2022   MPG 120 07/27/2022   No results found for: "PROLACTIN" Lab Results  Component Value Date   CHOL 182 07/27/2022   TRIG 92 07/27/2022   HDL 57 07/27/2022   CHOLHDL 3.2 07/27/2022   VLDL 18 07/27/2022   LDLCALC 107 (H) 07/27/2022    Physical Findings: AIMS:  , ,  ,  ,    CIWA:    COWS:     Musculoskeletal: Strength & Muscle Tone: within normal limits Gait & Station: normal Patient leans: N/A  Psychiatric Specialty Exam:  Presentation  General Appearance:  Bizarre; Casual  Eye Contact: Fair  Speech: Garbled  Speech Volume: Normal  Handedness: Right   Mood and Affect  Mood: Irritable  Affect: Labile   Thought Process  Thought Processes: Disorganized  Descriptions of Associations:Intact  Orientation:Full (Time, Place and Person)  Thought Content:Illogical  History of Schizophrenia/Schizoaffective disorder:Yes  Duration of Psychotic Symptoms:No data recorded Hallucinations:No data recorded Ideas of Reference:None  Suicidal Thoughts:No data recorded Homicidal Thoughts:No data recorded  Sensorium  Memory: Immediate Fair; Recent Poor; Remote Fair  Judgment: Poor  Insight: Poor   Executive Functions  Concentration: Fair  Attention Span: Fair  Recall: [employer] of Knowledge: Fair  Language: Fair   Psychomotor Activity  Psychomotor Activity:No data recorded  Assets  Assets: Communication Skills; Desire  for Improvement   Sleep  Sleep:No data recorded    Blood pressure (!) 121/58, pulse (!) 43, temperature 98 F (36.7 C), temperature source Oral, resp. rate (!) 21, height 5\' 11"  (1.803 m), weight 68.5 kg, SpO2 100 %. Body mass index is 21.06 kg/m.   Treatment Plan Summary: Daily contact with patient to assess and evaluate symptoms and progress in treatment, Medication management, and Plan continue current medications.  Encourage medication compliance.  Sarina Ill, DO 07/29/2022, 2:28 PM

## 2022-07-30 DIAGNOSIS — R45851 Suicidal ideations: Secondary | ICD-10-CM | POA: Diagnosis not present

## 2022-07-30 MED ORDER — IPRATROPIUM BROMIDE HFA 17 MCG/ACT IN AERS
2.0000 | INHALATION_SPRAY | RESPIRATORY_TRACT | Status: DC | PRN
  Administered 2022-07-30 (×2): 2 via RESPIRATORY_TRACT

## 2022-07-30 MED ORDER — SERTRALINE HCL 50 MG PO TABS: 50.0000 mg | ORAL_TABLET | Freq: Every day | ORAL | 3 refills | Status: AC

## 2022-07-30 MED ORDER — LORAZEPAM 0.5 MG PO TABS: 0.5000 mg | ORAL_TABLET | ORAL | 0 refills | Status: AC

## 2022-07-30 MED ORDER — IPRATROPIUM BROMIDE HFA 17 MCG/ACT IN AERS: 2.0000 | INHALATION_SPRAY | RESPIRATORY_TRACT | Status: DC

## 2022-07-30 MED ORDER — TRAZODONE HCL 50 MG PO TABS: 50.0000 mg | ORAL_TABLET | Freq: Every evening | ORAL | 3 refills | Status: AC | PRN

## 2022-07-30 MED ORDER — RISPERIDONE 0.5 MG PO TABS: 0.5000 mg | ORAL_TABLET | ORAL | 3 refills | Status: AC

## 2022-07-30 NOTE — Discharge Summary (Signed)
Physician Discharge Summary Note  Patient:  Dalton Harris is an 62 y.o., male MRN:  [id number] DOB:  1960/04/11 Patient phone:  [phone] (home)  Patient address:   [address] [place] [address],  Total Time spent with patient: 1 hour  Date of Admission:  07/24/2022 Date of Discharge: 07/30/2022  Reason for Admission:  The patient is a 62 year old male who was admitted to [facility] behavioral [facility] on an IVC with suicidal ideations and depression.  He admits to using methamphetamine prior to admission. Most of the information was obtained from the records and patient interview.  According to the records the patient initially presented himself voluntarily with complaints of suicidal ideations with a plan to shoot himself and substance use disorder.  Apparently he met some friends and claims that he had a shotgun and he was going to use it.  He claims that he met a friend and both of them started using methamphetamine.  Apparently has been abusing alcohol and methamphetamine for about 3 to 4 days.  He also uses THC and cocaine.  He started feeling helpless and hopeless and had some racing thoughts about past trauma that triggered thoughts to kill himself.  He also wrote a suicide note stating that he wanted to die and thought it would be a good thing for him to go out and kill himself.  Apparently he was sitting [place] the railroad tracks with a loaded gun.  He was felt to meet the criteria for inpatient admission.  Patient is on probation and his parole officer was contacted.  Principal Problem: Substance-induced psychotic disorder with delusions [facility]) Discharge Diagnoses: Principal Problem:   Substance-induced psychotic disorder with delusions (HCC) Active Problems:   Suicidal ideation   Past Psychiatric History: Substance-induced mood disorder  Past Medical History:  Past Medical History:  Diagnosis Date   Ischemic stroke (HCC)    History reviewed. No pertinent  surgical history. Family History: History reviewed. No pertinent family history. Family Psychiatric  History: Unremarkable Social History:  Social History   Substance and Sexual Activity  Alcohol Use Not Currently     Social History   Substance and Sexual Activity  Drug Use Yes   Types: Cocaine    Social History   Socioeconomic History   Marital status: Single    Spouse name: Not on file   Number of children: Not on file   Years of education: Not on file   Highest education level: Not on file  Occupational History   Not on file  Tobacco Use   Smoking status: Never   Smokeless tobacco: Never  Substance and Sexual Activity   Alcohol use: Not Currently   Drug use: Yes    Types: Cocaine   Sexual activity: Not on file  Other Topics Concern   Not on file  Social History Narrative   Not on file   Social Determinants of Health   Financial Resource Strain: Not on file  Food Insecurity: Patient Declined (07/24/2022)   Hunger Vital Sign    Worried About Running Out of Food in the Last Year: Patient declined    Ran Out of Food in the Last Year: Patient declined  Recent Concern: Food Insecurity - Food Insecurity Present (07/23/2022)   Hunger Vital Sign    Worried About Running Out of Food in the Last Year: Often true    Ran Out of Food in the Last Year: Often true  Transportation Needs: Patient Declined (07/24/2022)   PRAPARE -  Administrator, Civil Service (Medical): Patient declined    Lack of Transportation (Non-Medical): Patient declined  Recent Concern: Transportation Needs - Unmet Transportation Needs (07/23/2022)   PRAPARE - Administrator, Civil Service (Medical): Yes    Lack of Transportation (Non-Medical): Yes  Physical Activity: Not on file  Stress: Not on file  Social Connections: Not on file    Hospital Course: Dalton Harris was taken to the [facility], emergency room where he was not cooperating with staff and therefore was involuntarily committed  and transferred to [place].  While at at [facility] he was initially very agitated.  He had been using methamphetamine.  He denied any depression and refused medications at first but then started to take his medicines which included Zoloft, Ativan, trazodone, and Risperdal.  Mood and affect improved and his agitation decreased.  He denies side effects from his medications.  The remainder of his hospitalization he was pleasant and cooperative and in good controls.  It is felt that he maximized hospitalization and he was discharged home.  On the day of discharge she denied suicidal ideation, homicidal ideation, auditory or visual hallucinations.  His judgment and insight were good.  Physical Findings: AIMS:  , ,  ,  ,    CIWA:    COWS:     Musculoskeletal: Strength & Muscle Tone: within normal limits Gait & Station: normal Patient leans: N/A   Psychiatric Specialty Exam:  Presentation  General Appearance:  Bizarre; Casual  Eye Contact: Fair  Speech: Garbled  Speech Volume: Normal  Handedness: Right   Mood and Affect  Mood: Irritable  Affect: Labile   Thought Process  Thought Processes: Disorganized  Descriptions of Associations:Intact  Orientation:Full (Time, Place and Person)  Thought Content:Illogical  History of Schizophrenia/Schizoaffective disorder:Yes  Duration of Psychotic Symptoms:No data recorded Hallucinations:No data recorded Ideas of Reference:None  Suicidal Thoughts:No data recorded Homicidal Thoughts:No data recorded  Sensorium  Memory: Immediate Fair; Recent Poor; Remote Fair  Judgment: Poor  Insight: Poor   Executive Functions  Concentration: Fair  Attention Span: Fair  Recall: [employer] of Knowledge: Fair  Language: Fair   Psychomotor Activity  Psychomotor Activity:No data recorded  Assets  Assets: Communication Skills; Desire for Improvement   Sleep  Sleep:No data recorded   Physical Exam: Physical  Exam Vitals and nursing note reviewed.  Constitutional:      Appearance: Normal appearance. He is normal weight.  Neurological:     General: No focal deficit present.     Mental Status: He is alert and oriented to person, place, and time.  Psychiatric:        Attention and Perception: Attention and perception normal.        Mood and Affect: Mood and affect normal.        Speech: Speech normal.        Behavior: Behavior normal. Behavior is cooperative.        Thought Content: Thought content normal.        Cognition and Memory: Cognition and memory normal.        Judgment: Judgment normal.    Review of Systems  Constitutional: Negative.   HENT: Negative.    Eyes: Negative.   Respiratory: Negative.    Cardiovascular: Negative.   Gastrointestinal: Negative.   Genitourinary: Negative.   Musculoskeletal: Negative.   Skin: Negative.   Neurological: Negative.   Endo/Heme/Allergies: Negative.   Psychiatric/Behavioral: Negative.     Blood pressure 116/60, pulse (!) 49, temperature  98.2 F (36.8 C), temperature source Oral, resp. rate (!) 21, height 5\' 11"  (1.803 m), weight 68.5 kg, SpO2 100 %. Body mass index is 21.06 kg/m.   Social History   Tobacco Use  Smoking Status Never  Smokeless Tobacco Never   Tobacco Cessation:  A prescription for an FDA-approved tobacco cessation medication was offered at discharge and the patient refused   Blood Alcohol level:  Lab Results  Component Value Date   [facility] <10 02/17/2021   ETH <10 09/08/2020    Metabolic Disorder Labs:  Lab Results  Component Value Date   HGBA1C 5.8 (H) 07/27/2022   MPG 120 07/27/2022   No results found for: "PROLACTIN" Lab Results  Component Value Date   CHOL 182 07/27/2022   TRIG 92 07/27/2022   HDL 57 07/27/2022   CHOLHDL 3.2 07/27/2022   VLDL 18 07/27/2022   LDLCALC 107 (H) 07/27/2022    See Psychiatric Specialty Exam and Suicide Risk Assessment completed by Attending Physician prior to  discharge.  Discharge destination:  Home  Is patient on multiple antipsychotic therapies at discharge:  No   Has Patient had three or more failed trials of antipsychotic monotherapy by history:  No  Recommended Plan for Multiple Antipsychotic Therapies: NA   Allergies as of 07/30/2022       Reactions   Broccoli [brassica Oleracea] Anaphylaxis   Novocain [procaine] Anaphylaxis   Morphine And Codeine Other (See Comments)   Pt does not want Narcotics d/t hx of addiction.         Medication List     TAKE these medications      Indication  LORazepam 0.5 MG tablet Commonly known as: ATIVAN Take 1 tablet (0.5 mg total) by mouth 2 (two) times daily at 8 am and 4 pm.  Indication: Feeling Anxious   risperiDONE 0.5 MG tablet Commonly known as: RISPERDAL Take 1 tablet (0.5 mg total) by mouth 2 (two) times daily at 8 am and 4 pm.  Indication: Agitated Movements Accompanied by Emotional Distress   sertraline 50 MG tablet Commonly known as: ZOLOFT Take 1 tablet (50 mg total) by mouth daily. Start taking on: July 31, 2022  Indication: Generalized Anxiety Disorder   traZODone 50 MG tablet Commonly known as: DESYREL Take 1 tablet (50 mg total) by mouth at bedtime as needed for sleep.  Indication: Trouble Sleeping         Follow-up recommendations:  [facility]   Signed: Sarina Ill, DO 07/30/2022, 10:40 AM

## 2022-07-30 NOTE — Progress Notes (Signed)
  [facility] Adult Case Management Discharge Plan :  Will you be returning to the same living situation after discharge:  Yes,  pt plans to return home upon discharge. At discharge, do you have transportation home?: Yes,  CSW to arrange transportation, pt given a taxi voucher. Do you have the ability to pay for your medications: Yes,  [facility].  Release of information consent forms completed and in the chart;  Patient's signature needed at discharge.  Patient to Follow up at:  Follow-up Information     [facility] Follow up.   Specialty: Behavioral Health Why: Although you declined scheduled outpatient follow up, this is a provider who offers walk-in services on Mondays, Wednesdays, and Thursdays beginning at 8AM. It is recommended that you arrive prior to 7:30AM on the day that you walk in as clients are seen on a first come, first served basis. When you arrive, sign in at the front desk, and they will begin seeing people at 8AM. Thanks! Contact information: [address] [place] [address] [phone]                Next level of care provider has access to [facility] Link:yes  Safety Planning and Suicide Prevention discussed: Yes,  SPE completed with pt.     Has patient been referred to the Quitline?: Patient refused referral for treatment  Patient has been referred for addiction treatment: Patient refused referral for treatment.  Glenis Smoker, LCSW 07/30/2022, 1:21 PM

## 2022-07-30 NOTE — [facility] Suicide Risk Assessment (Signed)
[facility] INPATIENT:  Family/Significant Other Suicide Prevention Education  Suicide Prevention Education:  Patient Refusal for Family/Significant Other Suicide Prevention Education: The patient Dalton Harris has refused to provide written consent for family/significant other to be provided Family/Significant Other Suicide Prevention Education during admission and/or prior to discharge.  Physician notified.  SPE completed with pt, as pt refused to consent to family contact. SPI pamphlet provided to pt and pt was encouraged to share information with support network, ask questions, and talk about any concerns relating to SPE. Pt denies access to guns/firearms and verbalized understanding of information provided. Mobile Crisis information also provided to pt.  Glenis Smoker 07/30/2022, 9:46 AM

## 2022-07-30 NOTE — Progress Notes (Signed)
Patient pleasant and cooperative on approach. Denies SI,HI and AVH. Verbalized understanding discharge instructions,prescriptions and follow up care.  All belongings returned from BMU locker. Suicide safety plan filled by patient and placed in chart. Copy given to patient.Patient escorted out by staff and transported by cab. 

## 2022-07-30 NOTE — [facility] Counselor (Signed)
CSW met with pt briefly regarding discharge. Pt voiced plans to go home and informed CSW that he would need transportation to get there. He still declines scheduled outpatient follow up. Pt has clothes to wear upon discharge. He is a smoker who declined interest in cessation. He endorsed substance use and denied any interest in services to help with quitting. Pt asked when he could leave and CSW explained that it depended upon medication supply. Pt stated he didn't need any medication and it would be a waste because he would not take it. No other concerns expressed. Contact ended without incident.   Vilma Meckel. Algis Greenhouse, MSW, LCSW, LCAS 07/30/2022 10:51 AM

## 2022-07-30 NOTE — Group Note (Signed)
Date:  07/30/2022 Time:  6:59 AM  Group Topic/Focus:  Wrap-Up Group:   The focus of this group is to help patients review their daily goal of treatment and discuss progress on daily workbooks.    Participation Level:  Active  Participation Quality:  Appropriate and Monopolizing  Affect:  Appropriate  Cognitive:  Alert and Appropriate  Insight: Appropriate  Engagement in Group:  Engaged and Monopolizing  Modes of Intervention:  Discussion  Additional Comments:     Maglione,Loa Idler E 07/30/2022, 6:59 AM

## 2022-07-30 NOTE — Group Note (Signed)
Recreation Therapy Group Note   Group Topic:Goal Setting  Group Date: 07/30/2022 Start Time: 1000 End Time: 1115 Facilitators: Clinton Gallant, CTRS Location:  Craft Room  Group Description: Vision Board. Patients were given many different magazines, a glue stick, markers, and a piece of cardstock paper. LRT and pts discussed the importance of having goals in life. LRT and pts discussed the difference between short-term and long-term goals, as well as what a SMART goal is. LRT encouraged pts to create a vision board, with images they picked and then cut out with safety scissors from the magazine, for themselves, that capture their short and long-term goals. LRT encouraged pts to show and explain their vision board to the group. LRT offered to laminate vision board once dry and complete.   Goal Area(s) Addressed:  Patient will gain knowledge of short vs. long term goals.  Patient will identify goals for themselves. Patient will practice setting SMART goals. Patient will verbalize their goals to LRT and peers.  Affect/Mood: N/A   Participation Level: Did not attend    Clinical Observations/Individualized Feedback: Patient did not attend group.  Plan: Continue to engage patient in RT group sessions 2-3x/week.   Rosina Lowenstein, LRT, CTRS 07/30/2022 11:48 AM

## 2022-07-30 NOTE — Progress Notes (Signed)
No distress noted, he denies SI/HI/AVH interacting appropriately with peers and staff, not complaint with anti psychotic ( Seroquel) but compliant with trazodone for sleep. Patient was offered emotional support and encouragement will continue to monitor.

## 2022-07-30 NOTE — Progress Notes (Signed)
Writer called provider to request an order for an inhaler because patient complained of shortness of breath and he requested to be given and inhaler that was in his medication bin. New medication order in place for Atrovent  inhaler every 4 hours PRN SOB.

## 2022-07-30 NOTE — [facility] Suicide Risk Assessment (Signed)
[facility] Discharge Suicide Risk Assessment   Principal Problem: Substance-induced psychotic disorder with delusions (HCC) Discharge Diagnoses: Principal Problem:   Substance-induced psychotic disorder with delusions (HCC) Active Problems:   Suicidal ideation   Total Time spent with patient: 1 hour  Musculoskeletal: Strength & Muscle Tone: within normal limits Gait & Station: normal Patient leans: N/A  Psychiatric Specialty Exam  Presentation  General Appearance:  Bizarre; Casual  Eye Contact: Fair  Speech: Garbled  Speech Volume: Normal  Handedness: Right   Mood and Affect  Mood: Irritable  Duration of Depression Symptoms: Greater than two weeks  Affect: Labile   Thought Process  Thought Processes: Disorganized  Descriptions of Associations:Intact  Orientation:Full (Time, Place and Person)  Thought Content:Illogical  History of Schizophrenia/Schizoaffective disorder:Yes  Duration of Psychotic Symptoms:No data recorded Hallucinations:No data recorded Ideas of Reference:None  Suicidal Thoughts:No data recorded Homicidal Thoughts:No data recorded  Sensorium  Memory: Immediate Fair; Recent Poor; Remote Fair  Judgment: Poor  Insight: Poor   Executive Functions  Concentration: Fair  Attention Span: Fair  Recall: [employer] of Knowledge: Fair  Language: Fair   Psychomotor Activity  Psychomotor Activity:No data recorded  Assets  Assets: Communication Skills; Desire for Improvement   Sleep  Sleep:No data recorded   Blood pressure 116/60, pulse (!) 49, temperature 98.2 F (36.8 C), temperature source Oral, resp. rate (!) 21, height 5\' 11"  (1.803 m), weight 68.5 kg, SpO2 100 %. Body mass index is 21.06 kg/m.  Mental Status Per Nursing Assessment::   On Admission:  NA  Demographic Factors:  NA  Loss Factors: NA  Historical Factors: NA  Risk Reduction Factors:   NA  Continued Clinical Symptoms:   Alcohol/Substance Abuse/Dependencies  Cognitive Features That Contribute To Risk:  None    Suicide Risk:  Minimal: No identifiable suicidal ideation.  Patients presenting with no risk factors but with morbid ruminations; may be classified as minimal risk based on the severity of the depressive symptoms    Plan Of Care/Follow-up recommendations: [place]   Sarina Ill, DO 07/30/2022, 10:33 AM

## 2022-10-25 ENCOUNTER — Emergency Department (HOSPITAL_COMMUNITY)
Admission: EM | Admit: 2022-10-25 | Discharge: 2022-10-26 | Disposition: A | Discharge status: Home / Self Care | Payer: MEDICAID | Attending: Emergency Medicine | Admitting: Emergency Medicine

## 2022-10-25 ENCOUNTER — Other Ambulatory Visit: Payer: Self-pay

## 2022-10-25 ENCOUNTER — Emergency Department (HOSPITAL_COMMUNITY): Payer: MEDICAID

## 2022-10-25 ENCOUNTER — Encounter (HOSPITAL_COMMUNITY): Payer: Self-pay

## 2022-10-25 DIAGNOSIS — R0789 Other chest pain: Secondary | ICD-10-CM

## 2022-10-25 DIAGNOSIS — R079 Chest pain, unspecified: Secondary | ICD-10-CM | POA: Diagnosis present

## 2022-10-25 DIAGNOSIS — F1910 Other psychoactive substance abuse, uncomplicated: Secondary | ICD-10-CM

## 2022-10-25 LAB — URINALYSIS, ROUTINE W REFLEX MICROSCOPIC
Bilirubin Urine: NEGATIVE
Glucose, UA: NEGATIVE mg/dL
Hgb urine dipstick: NEGATIVE
Ketones, ur: NEGATIVE mg/dL
Leukocytes,Ua: NEGATIVE
Nitrite: NEGATIVE
Protein, ur: NEGATIVE mg/dL
Specific Gravity, Urine: 1.005 (ref 1.005–1.030)
pH: 6 (ref 5.0–8.0)

## 2022-10-25 LAB — CBC WITH DIFFERENTIAL/PLATELET
Abs Immature Granulocytes: 0.04 10*3/uL (ref 0.00–0.07)
Basophils Absolute: 0.1 10*3/uL (ref 0.0–0.1)
Basophils Relative: 1 %
Eosinophils Absolute: 0.1 10*3/uL (ref 0.0–0.5)
Eosinophils Relative: 1 %
HCT: 44.2 % (ref 39.0–52.0)
Hemoglobin: 14.7 g/dL (ref 13.0–17.0)
Immature Granulocytes: 1 %
Lymphocytes Relative: 31 %
Lymphs Abs: 2.8 10*3/uL (ref 0.7–4.0)
MCH: 29.8 pg (ref 26.0–34.0)
MCHC: 33.3 g/dL (ref 30.0–36.0)
MCV: 89.7 fL (ref 80.0–100.0)
Monocytes Absolute: 0.6 10*3/uL (ref 0.1–1.0)
Monocytes Relative: 7 %
Neutro Abs: 5.3 10*3/uL (ref 1.7–7.7)
Neutrophils Relative %: 59 %
Platelets: 275 10*3/uL (ref 150–400)
RBC: 4.93 MIL/uL (ref 4.22–5.81)
RDW: 13.4 % (ref 11.5–15.5)
WBC: 8.9 10*3/uL (ref 4.0–10.5)
nRBC: 0 % (ref 0.0–0.2)

## 2022-10-25 LAB — MAGNESIUM: Magnesium: 2 mg/dL (ref 1.7–2.4)

## 2022-10-25 LAB — COMPREHENSIVE METABOLIC PANEL
ALT: 13 U/L (ref 0–44)
AST: 16 U/L (ref 15–41)
Albumin: 3.8 g/dL (ref 3.5–5.0)
Alkaline Phosphatase: 65 U/L (ref 38–126)
Anion gap: 15 (ref 5–15)
BUN: 8 mg/dL (ref 8–23)
CO2: 20 mmol/L — ABNORMAL LOW (ref 22–32)
Calcium: 9.1 mg/dL (ref 8.9–10.3)
Chloride: 104 mmol/L (ref 98–111)
Creatinine, Ser: 0.96 mg/dL (ref 0.61–1.24)
GFR, Estimated: >60 mL/min (ref >60)
Glucose, Bld: 87 mg/dL (ref 70–99)
Potassium: 3.5 mmol/L (ref 3.5–5.1)
Sodium: 139 mmol/L (ref 135–145)
Total Bilirubin: 0.5 mg/dL (ref 0.3–1.2)
Total Protein: 6.3 g/dL — ABNORMAL LOW (ref 6.5–8.1)

## 2022-10-25 LAB — RAPID URINE DRUG SCREEN, HOSP PERFORMED
Amphetamines: NOT DETECTED
Barbiturates: NOT DETECTED
Benzodiazepines: NOT DETECTED
Cocaine: NOT DETECTED
Opiates: NOT DETECTED
Tetrahydrocannabinol: POSITIVE — AB

## 2022-10-25 LAB — LIPASE, BLOOD: Lipase: 45 U/L (ref 11–51)

## 2022-10-25 LAB — TROPONIN I (HIGH SENSITIVITY): Troponin I (High Sensitivity): 3 ng/L (ref <18)

## 2022-10-25 MED ORDER — SODIUM CHLORIDE 0.9 % IV SOLN: Freq: Once | INTRAVENOUS | Status: AC

## 2022-10-25 NOTE — ED Provider Notes (Signed)
[place] EMERGENCY DEPARTMENT AT [facility] Provider Note   CSN: [id number] Arrival date & time: 10/25/22  2019     History  Chief Complaint  Patient presents with   Chest Pain    Dalton Harris is a 62 y.o. male.  HPI Patient reports that he got sudden onset of bad chest pain this morning.  He had first described it as being a new problem but then later qualified that he fairly frequently gets this symptom.  He reports that he had sharp and uncomfortable feeling in his chest he indicates that at several levels of ribs on the right side of the sternum.  He reports in order to try to treat his pain and slow his heart rate, he smoked marijuana and drank alcohol.  Patient admitted to crack use the day before.  Denies he had fever or vomiting.  Denies cough or shortness of breath.  Denies lower extremity swelling.    Home Medications Prior to Admission medications   Medication Sig Start Date End Date Taking? Authorizing Provider  famotidine (PEPCID) 20 MG tablet Take 1 tablet (20 mg total) by mouth 2 (two) times daily. 10/26/22  Yes Arby Barrette, MD  LORazepam (ATIVAN) 0.5 MG tablet Take 1 tablet (0.5 mg total) by mouth 2 (two) times daily at 8 am and 4 pm. 07/30/22   Sarina Ill, DO  risperiDONE (RISPERDAL) 0.5 MG tablet Take 1 tablet (0.5 mg total) by mouth 2 (two) times daily at 8 am and 4 pm. 07/30/22   Sarina Ill, DO  sertraline (ZOLOFT) 50 MG tablet Take 1 tablet (50 mg total) by mouth daily. 07/31/22   Sarina Ill, DO  traZODone (DESYREL) 50 MG tablet Take 1 tablet (50 mg total) by mouth at bedtime as needed for sleep. 07/30/22   Sarina Ill, DO      Allergies    Cy Blamer [place] oleracea], Novocain [procaine], and Morphine and codeine    Review of Systems   Review of Systems  Physical Exam Updated Vital Signs BP 132/73   Pulse (!) 54   Temp 98.7 F (37.1 C) (Oral)   Resp 16   Ht 5\' 11"  (1.803 m)   SpO2 99%    BMI 21.06 kg/m  Physical Exam Constitutional:      Comments: Alert nontoxic no acute distress.  HENT:     Mouth/Throat:     Pharynx: Oropharynx is clear.  Eyes:     Extraocular Movements: Extraocular movements intact.  Cardiovascular:     Rate and Rhythm: Normal rate and regular rhythm.     Pulses: Normal pulses.     Heart sounds: Normal heart sounds.  Pulmonary:     Effort: Pulmonary effort is normal.     Breath sounds: Normal breath sounds.  Abdominal:     General: There is no distension.     Palpations: Abdomen is soft.     Tenderness: There is no abdominal tenderness. There is no guarding.  Musculoskeletal:        General: No swelling or tenderness. Normal range of motion.     Right lower leg: No edema.     Left lower leg: No edema.  Skin:    General: Skin is warm and dry.  Neurological:     General: No focal deficit present.     Mental Status: He is oriented to person, place, and time.     Motor: No weakness.     Coordination: Coordination normal.  Psychiatric:  Comments: Mood is labile.  Patient has been appropriate and interactive with me.  At times however he is really escalated with staff with yelling.     ED Results / Procedures / Treatments   Labs (all labs ordered are listed, but only abnormal results are displayed) Labs Reviewed  COMPREHENSIVE METABOLIC PANEL - Abnormal; Notable for the following components:      Result Value   CO2 20 (*)    Total Protein 6.3 (*)    All other components within normal limits  URINALYSIS, ROUTINE W REFLEX MICROSCOPIC - Abnormal; Notable for the following components:   Color, Urine STRAW (*)    All other components within normal limits  RAPID URINE DRUG SCREEN, HOSP PERFORMED - Abnormal; Notable for the following components:   Tetrahydrocannabinol POSITIVE (*)    All other components within normal limits  LIPASE, BLOOD  CBC WITH DIFFERENTIAL/PLATELET  MAGNESIUM  TROPONIN I (HIGH SENSITIVITY)  TROPONIN I (HIGH  SENSITIVITY)    EKG EKG Interpretation Date/Time:  Thursday October 25 2022 20:26:32 EDT Ventricular Rate:  60 PR Interval:  173 QRS Duration:  90 QT Interval:  416 QTC Calculation: 416 R Axis:   79  Text Interpretation: Sinus rhythm Minimal ST elevation, inferior leads diffuse early repolarization seen on earlier tracing. Confirmed by Arby Barrette [phone]) on 10/25/2022 9:30:33 PM  Radiology DG Chest Port 1 View  Result Date: 10/26/2022 CLINICAL DATA:  Chest pain. EXAM: PORTABLE CHEST 1 VIEW COMPARISON:  Jun 04, 2022 FINDINGS: The heart size and mediastinal contours are within normal limits. There is mild to moderate severity calcification of the aortic arch. Both lungs are clear. The visualized skeletal structures are unremarkable. IMPRESSION: No acute cardiopulmonary disease. Electronically Signed   By: Aram Candela M.D.   On: 10/26/2022 00:31    Procedures Procedures    Medications Ordered in ED Medications  0.9 %  sodium chloride infusion (0 mLs Intravenous Stopped 10/26/22 0122)    ED Course/ Medical Decision Making/ A&P                                 Medical Decision Making Amount and/or Complexity of Data Reviewed Labs: ordered. Radiology: ordered.  Risk Prescription drug management.   Patient presents as outlined with report of right sided chest pain that seems fairly focal.  By description it sounds like this is a more frequent occurrence.  Patient's vital signs are normal.  Blood pressure is normal heart rate is controlled in the 50s and patient has 100% oxygen saturation.  Clinically he is not in acute distress and has a normal cardiovascular exam.  Will proceed with diagnostic evaluation for chest pain.  Cardiac enzymes normal and flat with troponin of 4.  Lipase 45.  Metabolic panel normal LFTs otherwise normal white count normal normal differential.  Urinalysis negative.  Chest x-ray reviewed by radiology no acute findings.  I have reassessed the  patient and he is clinically well in appearance no distress.  He has been asking for food.  Reviewing patient's results and plan, patient was appreciative and polite with me but then in short order was escalating and yelling at staff members.  At this time I do feel he is stable for discharge.  We discussed the fact that polysubstance abuse may be exacerbating or causing symptoms.  Patient is using crack and self treating pain with marijuana and alcohol.  Resources have been included in discharge instructions  to seek treatment and additional care.        Final Clinical Impression(s) / ED Diagnoses Final diagnoses:  Atypical chest pain  Polysubstance abuse (HCC)    Rx / DC Orders ED Discharge Orders          Ordered    famotidine (PEPCID) 20 MG tablet  2 times daily        10/26/22 0116              Arby Barrette, MD 10/26/22 [phone]

## 2022-10-25 NOTE — ED Triage Notes (Signed)
Pt brought in [facility] with complaint of chest pain. EMS arrived to pt laying on floor. CP started at 1000 today, describes it as aching and pt says he feels palpitations. He drank alcohol and smoked marijuana to "decrease his heart rate." Per pt. Pt states he did crack yesterday. No nausea or vomiting. No current SOB. Pt arrived on 2L [place]. EKG normal sinus.   EMS vital signs: 324 aspirin 2 nitroglycerin  125 CBG 121/62 60s HR  18 RR 99.1 temp

## 2022-10-26 LAB — TROPONIN I (HIGH SENSITIVITY): Troponin I (High Sensitivity): 4 ng/L (ref <18)

## 2022-10-26 MED ORDER — FAMOTIDINE 20 MG PO TABS: 20.0000 mg | ORAL_TABLET | Freq: Two times a day (BID) | ORAL | 1 refills | Status: AC

## 2022-10-26 NOTE — Discharge Instructions (Addendum)
1.  At this time your heart labs are normal.  It does not show evidence of a heart attack tonight.  You do need to follow-up closely with your doctor for recheck. 2.  Sometimes gastroesophageal reflux disease can cause severe chest pain.  Review information enclosed in your discharge instructions about this condition and how to avoid it.  Take Pepcid twice a day for the next 2 weeks. 3.  Avoid drug and alcohol use.  These could make all symptoms of chest pain reflux or other medical conditions much worse.

## 2022-10-26 NOTE — ED Notes (Signed)
Discharge instructions reviewed by Carley Hammed, RN. Pt ambulatory to ED waiting room with steady gait.

## 2022-10-26 NOTE — ED Notes (Signed)
While attempting to discharge pt, pt began screaming foul language at this RN and jerking monitor off. This RN attempted to deescalate the situation and pt continued jerking gown and monitor off and throwing monitoring equipment. This RN exited room for safety. Security called to bedside.

## 2023-07-22 IMAGING — CT CT HEAD CODE STROKE
4 series · 16 of 47 positions shown, 18 images · non-contrast
Comparison: Prior CT from 07/10/2020.

CLINICAL DATA: Code stroke. Initial evaluation for acute neuro
deficit, stroke suspected.



[Series 3: head wo · axial · 0.51mm/px · z∈[-138,-14]mm · 7 of 35 slices shown, 9 images]
[im 5/35  brain]
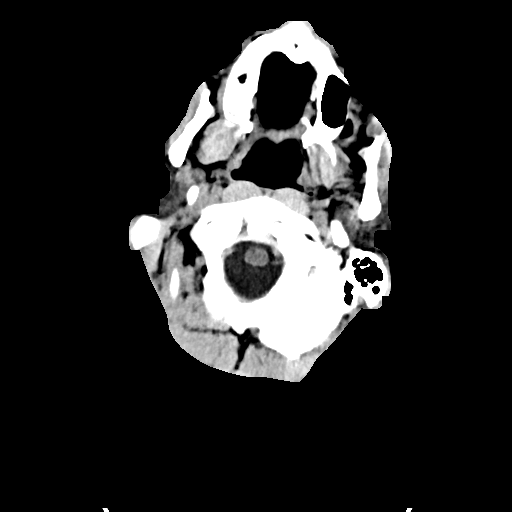
[im 5/35  bone]
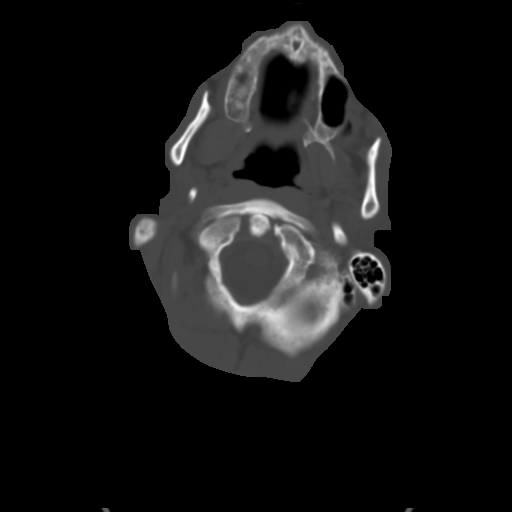
[im 9/35  brain]
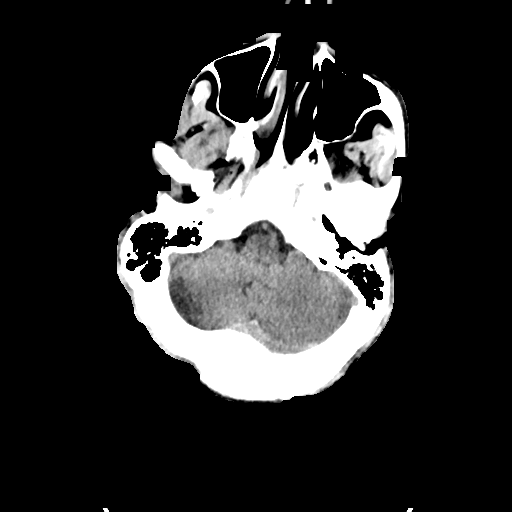
[im 13/35  brain]
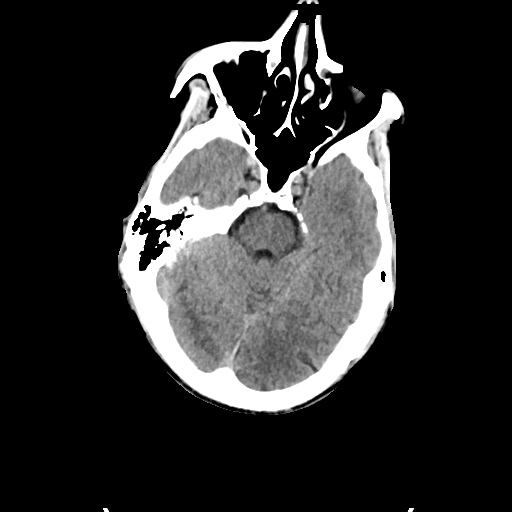
[im 18/35  brain]
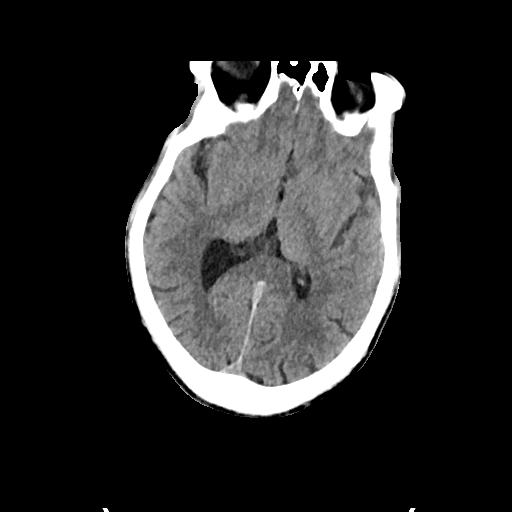
[im 22/35  brain]
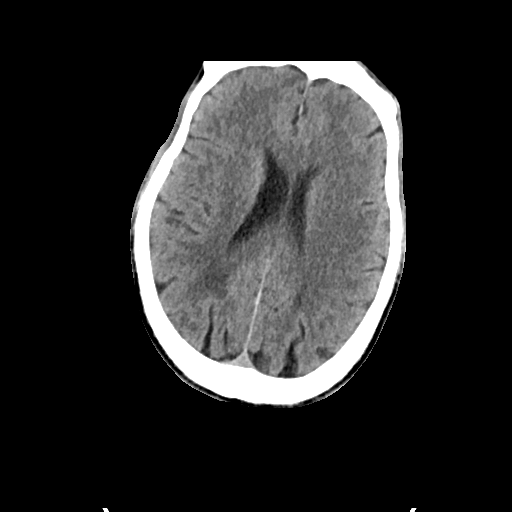
[im 22/35  bone]
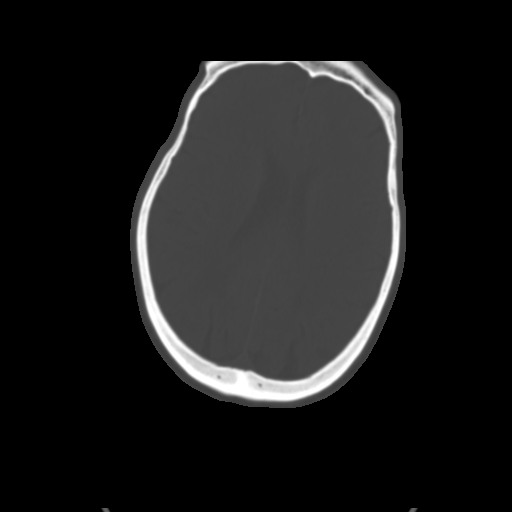
[im 26/35  brain]
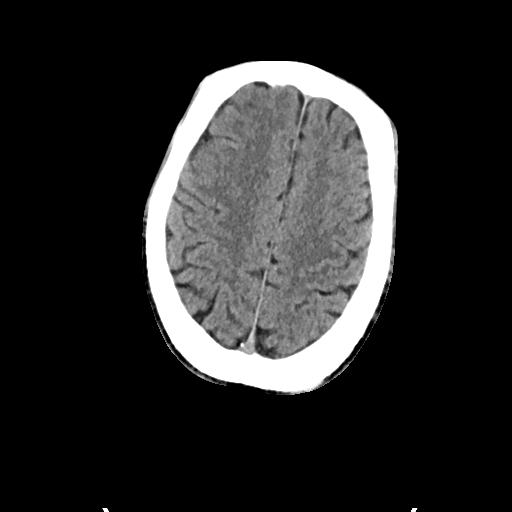
[im 30/35  brain]
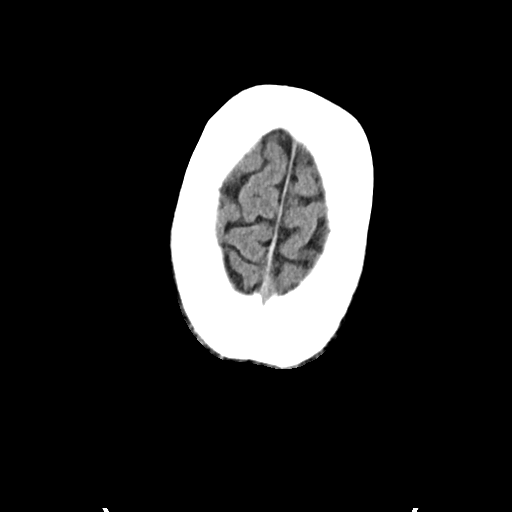

[Series 4: head bone · axial · 0.51mm/px · z∈[-142,-106]mm · 3 of 88 slices shown]
[im 9/88  bone]
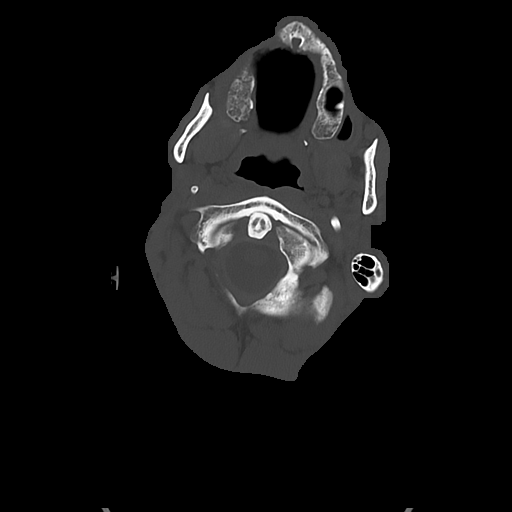
[im 18/88  bone]
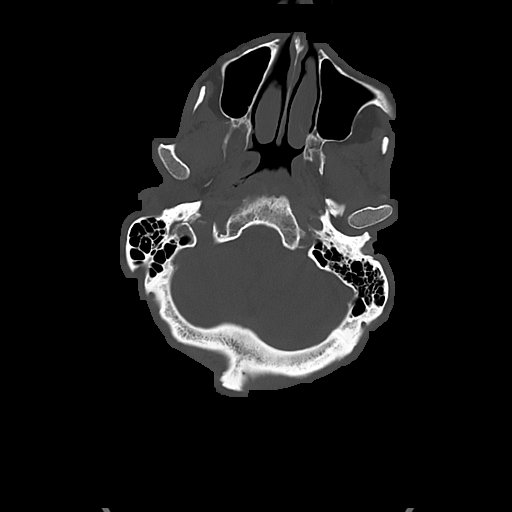
[im 27/88  bone]
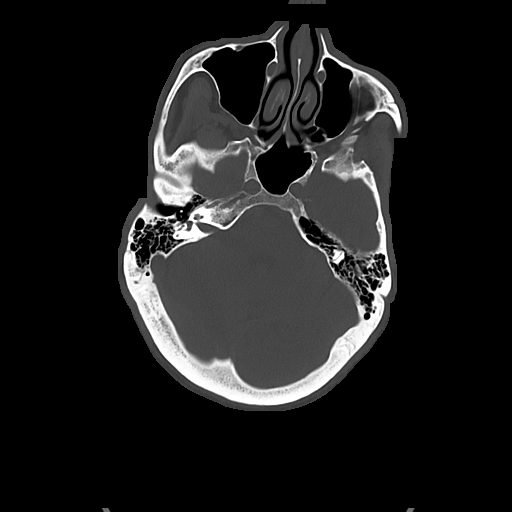

[Series 5: cor soft · coronal · 0.36mm/px · 3 of 75 slices shown]
[im 25/75  brain]
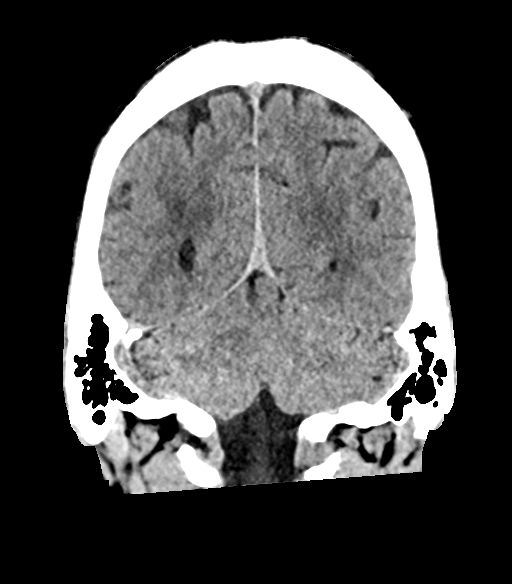
[im 33/75  brain]
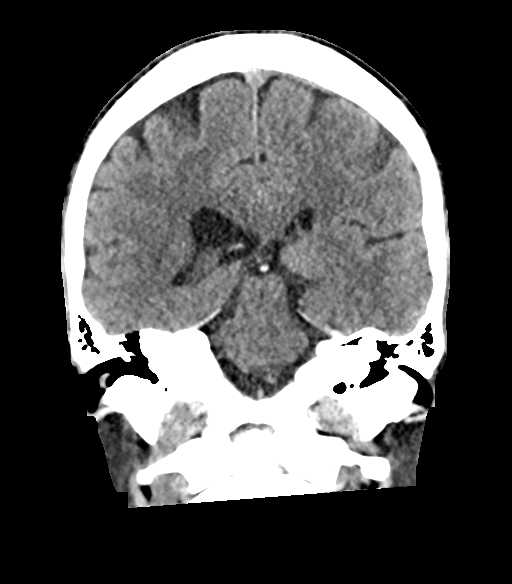
[im 42/75  brain]
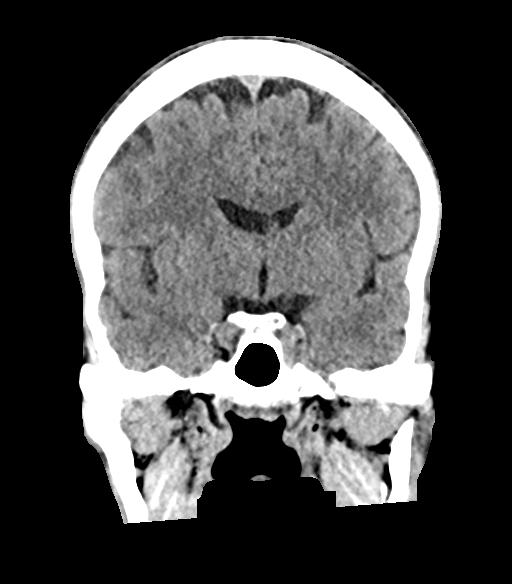

[Series 6: sag soft · sagittal · 0.44mm/px · 3 of 56 slices shown]
[im 19/56  brain]
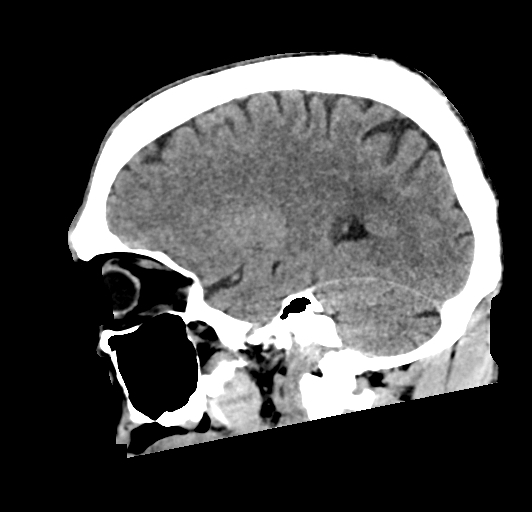
[im 28/56  brain]
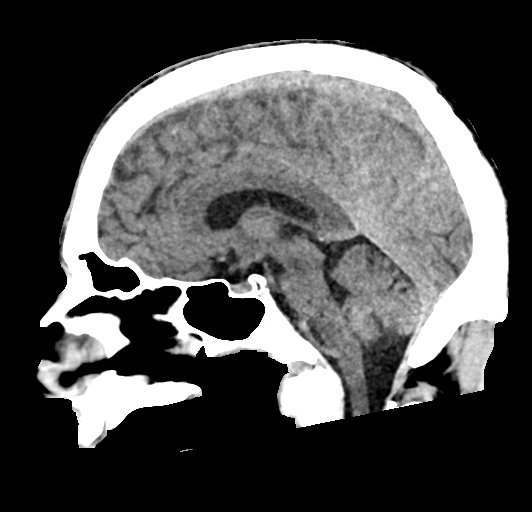
[im 37/56  brain]
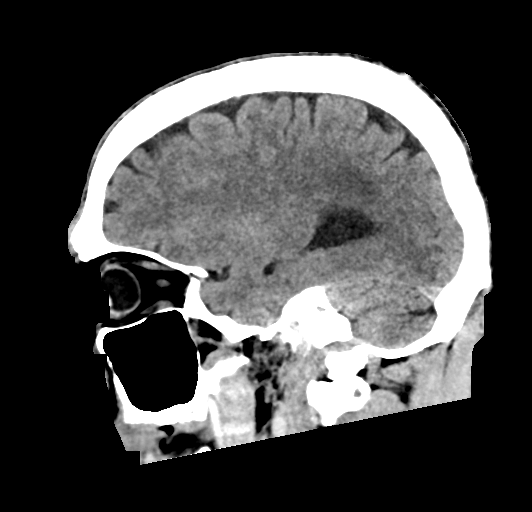

[16 of 47 positions shown; findings below may reference images not displayed]

FINDINGS: Brain: Age-related cerebral atrophy with chronic microvascular
ischemic disease. No acute intracranial hemorrhage. No acute large
vessel territory infarct. No mass lesion or midline shift. No
hydrocephalus or extra-axial fluid collection.

Vascular: No hyperdense vessel. Calcified atherosclerosis present at
the skull base.

Skull: Scalp soft tissues and calvarium within normal limits.

Sinuses/Orbits: Left gaze noted. Paranasal sinuses and mastoid air
cells are clear.

Other: None.

ASPECTS (Alberta Stroke Program Early CT Score)

- Ganglionic level infarction (caudate, lentiform nuclei, internal
capsule, insula, M1-M3 cortex): 7

- Supraganglionic infarction (M4-M6 cortex): 3

Total score (0-10 with 10 being normal): 10
IMPRESSION: 1. No acute intracranial abnormality.
2. ASPECTS is 10.
3. Chronic microvascular ischemic disease, stable.

These results were communicated to Dr. Delsa at [DATE] on
02/17/2021 by text page via the [employer] messaging system.

## 2023-07-22 IMAGING — DX DG CHEST 1V PORT
2 series · 2 of 2 positions shown · non-contrast
Comparison: 11/21/2020

CLINICAL DATA: Fall, chest pain

EXAM:
PORTABLE CHEST 1 VIEW

[chest ap (1 of 2)]
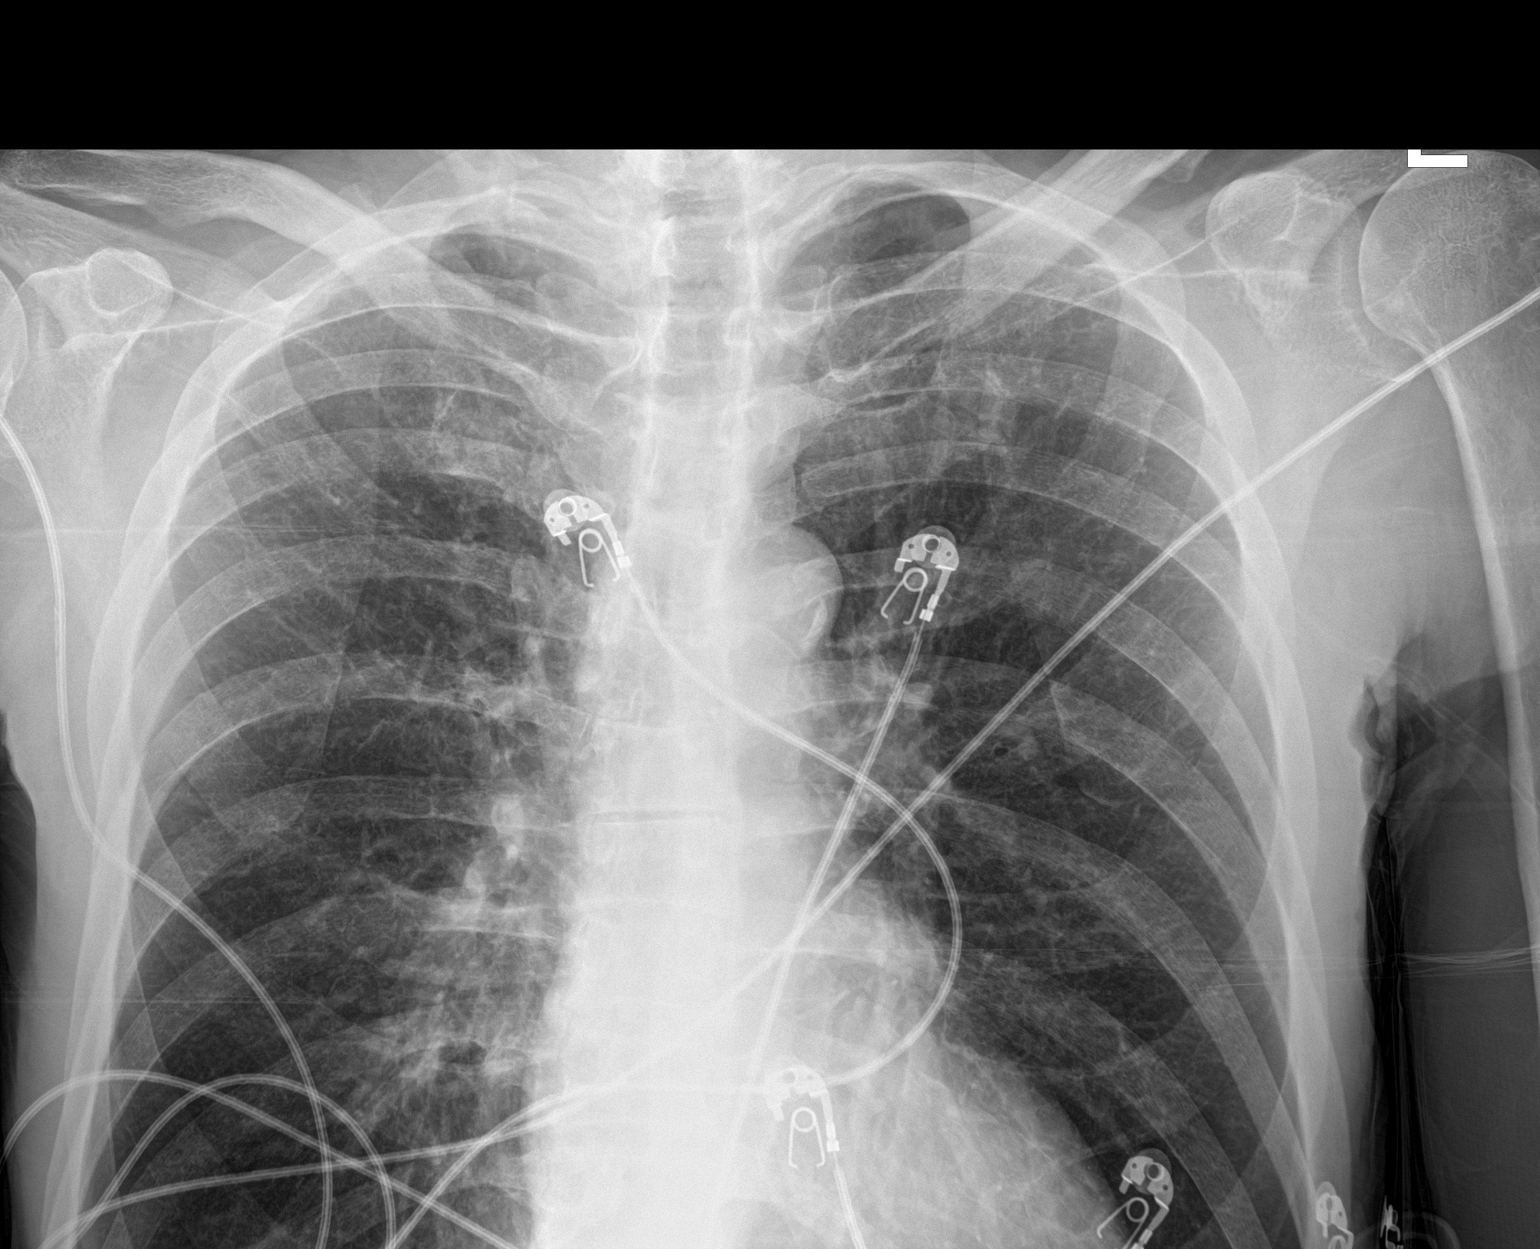

[chest ap (2 of 2)]
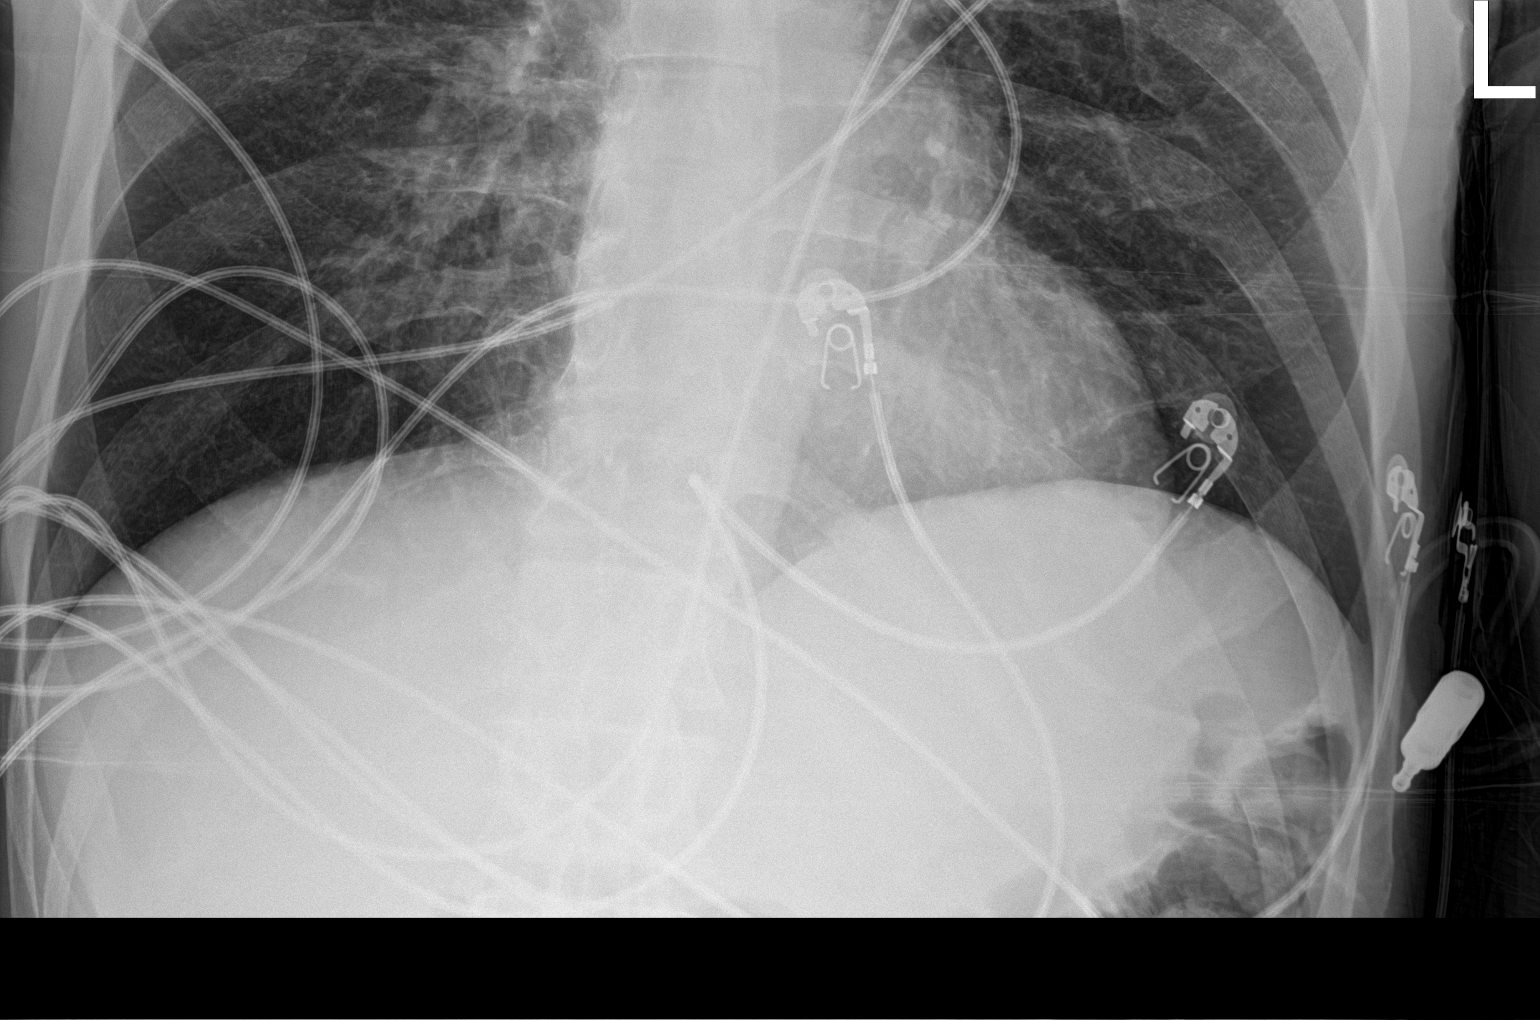

[2 of 2 positions shown; findings below may reference images not displayed]

FINDINGS: The heart size and mediastinal contours are within normal limits.
Both lungs are clear. The visualized skeletal structures are
unremarkable.
IMPRESSION: No active disease.
